# Patient Record
Sex: Female | Born: 1965 | Hispanic: Yes | Smoking: Never smoker
Health system: Southern US, Community
[De-identification: ages and names within clinical notes are randomized; demographics above are authoritative.]

## PROBLEM LIST (undated history)

## (undated) DIAGNOSIS — F410 Panic disorder [episodic paroxysmal anxiety] without agoraphobia: Secondary | ICD-10-CM

## (undated) DIAGNOSIS — F419 Anxiety disorder, unspecified: Secondary | ICD-10-CM

## (undated) DIAGNOSIS — D509 Iron deficiency anemia, unspecified: Secondary | ICD-10-CM

---

## 2010-01-08 ENCOUNTER — Ambulatory Visit: Payer: Self-pay | Admitting: Certified Nurse Midwife

## 2012-03-16 ENCOUNTER — Emergency Department: Payer: Self-pay | Admitting: Emergency Medicine

## 2012-08-16 ENCOUNTER — Emergency Department: Payer: Self-pay | Admitting: Emergency Medicine

## 2012-08-16 LAB — CBC WITH DIFFERENTIAL/PLATELET
Eosinophil %: 1.5 %
HCT: 39.4 % (ref 35.0–47.0)
Lymphocyte #: 3 10*3/uL (ref 1.0–3.6)
Lymphocyte %: 33.6 %
MCH: 29.9 pg (ref 26.0–34.0)
MCHC: 33.5 g/dL (ref 32.0–36.0)
MCV: 89 fL (ref 80–100)
Monocyte #: 0.7 x10 3/mm (ref 0.2–0.9)
Neutrophil #: 5.1 10*3/uL (ref 1.4–6.5)
Neutrophil %: 56.5 %
RBC: 4.4 10*6/uL (ref 3.80–5.20)

## 2012-08-16 LAB — URINALYSIS, COMPLETE
Bacteria: NONE SEEN
Bilirubin,UR: NEGATIVE
Ketone: NEGATIVE
Leukocyte Esterase: NEGATIVE
Nitrite: NEGATIVE
Ph: 8 (ref 4.5–8.0)
RBC,UR: 1 /HPF (ref 0–5)
Specific Gravity: 1.005 (ref 1.003–1.030)
Squamous Epithelial: 2

## 2012-08-16 LAB — COMPREHENSIVE METABOLIC PANEL
Albumin: 3.9 g/dL (ref 3.4–5.0)
Alkaline Phosphatase: 75 U/L (ref 50–136)
BUN: 8 mg/dL (ref 7–18)
Calcium, Total: 8.6 mg/dL (ref 8.5–10.1)
Creatinine: 0.58 mg/dL — ABNORMAL LOW (ref 0.60–1.30)
EGFR (African American): >60
EGFR (Non-African Amer.): >60
SGOT(AST): 21 U/L (ref 15–37)
SGPT (ALT): 18 U/L (ref 12–78)
Sodium: 141 mmol/L (ref 136–145)
Total Protein: 7.5 g/dL (ref 6.4–8.2)

## 2012-11-08 ENCOUNTER — Emergency Department: Payer: Self-pay | Admitting: Emergency Medicine

## 2012-11-09 LAB — COMPREHENSIVE METABOLIC PANEL
Albumin: 3.5 g/dL (ref 3.4–5.0)
Alkaline Phosphatase: 81 U/L (ref 50–136)
BUN: 5 mg/dL — ABNORMAL LOW (ref 7–18)
Bilirubin,Total: 0.6 mg/dL (ref 0.2–1.0)
Chloride: 103 mmol/L (ref 98–107)
Co2: 27 mmol/L (ref 21–32)
Creatinine: 0.56 mg/dL — ABNORMAL LOW (ref 0.60–1.30)
Glucose: 89 mg/dL (ref 65–99)
Potassium: 3.5 mmol/L (ref 3.5–5.1)
SGOT(AST): 18 U/L (ref 15–37)
Sodium: 139 mmol/L (ref 136–145)
Total Protein: 7 g/dL (ref 6.4–8.2)

## 2012-11-09 LAB — URINALYSIS, COMPLETE
Bilirubin,UR: NEGATIVE
Leukocyte Esterase: NEGATIVE
Nitrite: NEGATIVE
Protein: NEGATIVE
RBC,UR: 1 /HPF (ref 0–5)
Specific Gravity: 1.005 (ref 1.003–1.030)
Squamous Epithelial: 1
WBC UR: NONE SEEN /HPF (ref 0–5)

## 2012-11-09 LAB — CBC
HCT: 36.6 % (ref 35.0–47.0)
HGB: 12.3 g/dL (ref 12.0–16.0)
MCH: 29.6 pg (ref 26.0–34.0)
MCHC: 33.6 g/dL (ref 32.0–36.0)
MCV: 88 fL (ref 80–100)
RBC: 4.16 10*6/uL (ref 3.80–5.20)
RDW: 13.3 % (ref 11.5–14.5)
WBC: 7.4 10*3/uL (ref 3.6–11.0)

## 2013-02-18 ENCOUNTER — Emergency Department: Payer: Self-pay | Admitting: Emergency Medicine

## 2013-02-18 LAB — COMPREHENSIVE METABOLIC PANEL
Alkaline Phosphatase: 79 U/L (ref 50–136)
Anion Gap: 7 (ref 7–16)
BUN: 7 mg/dL (ref 7–18)
Bilirubin,Total: 0.2 mg/dL (ref 0.2–1.0)
Calcium, Total: 8.4 mg/dL — ABNORMAL LOW (ref 8.5–10.1)
Chloride: 105 mmol/L (ref 98–107)
Co2: 27 mmol/L (ref 21–32)
EGFR (African American): >60
EGFR (Non-African Amer.): >60
Glucose: 81 mg/dL (ref 65–99)
Osmolality: 275 (ref 275–301)
SGOT(AST): 21 U/L (ref 15–37)
SGPT (ALT): 19 U/L (ref 12–78)
Sodium: 139 mmol/L (ref 136–145)
Total Protein: 7.2 g/dL (ref 6.4–8.2)

## 2013-02-18 LAB — DRUG SCREEN, URINE
Amphetamines, Ur Screen: NEGATIVE (ref ?–1000)
Barbiturates, Ur Screen: NEGATIVE (ref ?–200)
Benzodiazepine, Ur Scrn: NEGATIVE (ref ?–200)
Cocaine Metabolite,Ur ~~LOC~~: NEGATIVE (ref ?–300)
MDMA (Ecstasy)Ur Screen: NEGATIVE (ref ?–500)
Opiate, Ur Screen: NEGATIVE (ref ?–300)
Tricyclic, Ur Screen: NEGATIVE (ref ?–1000)

## 2013-02-18 LAB — URINALYSIS, COMPLETE
Bilirubin,UR: NEGATIVE
Glucose,UR: NEGATIVE mg/dL (ref 0–75)
Ketone: NEGATIVE
Leukocyte Esterase: NEGATIVE
Ph: 7 (ref 4.5–8.0)
Protein: NEGATIVE
RBC,UR: NONE SEEN /HPF (ref 0–5)
Specific Gravity: 1.002 (ref 1.003–1.030)
Squamous Epithelial: 1

## 2013-02-18 LAB — CBC
HCT: 36.5 % (ref 35.0–47.0)
HGB: 12.8 g/dL (ref 12.0–16.0)
MCHC: 35 g/dL (ref 32.0–36.0)
MCV: 89 fL (ref 80–100)
Platelet: 357 10*3/uL (ref 150–440)
RBC: 4.12 10*6/uL (ref 3.80–5.20)
RDW: 13.4 % (ref 11.5–14.5)

## 2013-02-18 LAB — PREGNANCY, URINE: Pregnancy Test, Urine: NEGATIVE m[IU]/mL

## 2013-02-18 LAB — TSH: Thyroid Stimulating Horm: 1.95 u[IU]/mL

## 2013-02-18 LAB — ETHANOL: Ethanol: <3 mg/dL

## 2013-08-16 ENCOUNTER — Inpatient Hospital Stay: Payer: Self-pay | Admitting: Psychiatry

## 2013-08-16 LAB — COMPREHENSIVE METABOLIC PANEL
ALK PHOS: 73 U/L
AST: 19 U/L (ref 15–37)
Albumin: 3.2 g/dL — ABNORMAL LOW (ref 3.4–5.0)
Anion Gap: 5 — ABNORMAL LOW (ref 7–16)
BILIRUBIN TOTAL: 0.2 mg/dL (ref 0.2–1.0)
BUN: 10 mg/dL (ref 7–18)
Calcium, Total: 8.1 mg/dL — ABNORMAL LOW (ref 8.5–10.1)
Chloride: 106 mmol/L (ref 98–107)
Co2: 26 mmol/L (ref 21–32)
Creatinine: 0.53 mg/dL — ABNORMAL LOW (ref 0.60–1.30)
EGFR (Non-African Amer.): >60
GLUCOSE: 84 mg/dL (ref 65–99)
Osmolality: 272 (ref 275–301)
Potassium: 3.9 mmol/L (ref 3.5–5.1)
SGPT (ALT): 20 U/L (ref 12–78)
Sodium: 137 mmol/L (ref 136–145)
Total Protein: 6.8 g/dL (ref 6.4–8.2)

## 2013-08-16 LAB — URINALYSIS, COMPLETE
Bacteria: NONE SEEN
Bilirubin,UR: NEGATIVE
Glucose,UR: NEGATIVE mg/dL (ref 0–75)
Ketone: NEGATIVE
LEUKOCYTE ESTERASE: NEGATIVE
Nitrite: NEGATIVE
PROTEIN: NEGATIVE
Ph: 8 (ref 4.5–8.0)
SPECIFIC GRAVITY: 1.004 (ref 1.003–1.030)
Squamous Epithelial: 2
WBC UR: <1 /HPF (ref 0–5)

## 2013-08-16 LAB — ACETAMINOPHEN LEVEL: Acetaminophen: <2 ug/mL

## 2013-08-16 LAB — DRUG SCREEN, URINE

## 2013-08-16 LAB — CBC
HCT: 36.7 % (ref 35.0–47.0)
HGB: 12.5 g/dL (ref 12.0–16.0)
MCH: 31 pg (ref 26.0–34.0)
MCHC: 33.9 g/dL (ref 32.0–36.0)
MCV: 92 fL (ref 80–100)
Platelet: 346 10*3/uL (ref 150–440)
RBC: 4.01 10*6/uL (ref 3.80–5.20)
RDW: 13.4 % (ref 11.5–14.5)
WBC: 7.7 10*3/uL (ref 3.6–11.0)

## 2013-08-16 LAB — SALICYLATE LEVEL: Salicylates, Serum: <1.7 mg/dL

## 2013-08-16 LAB — ETHANOL
Ethanol %: <0.003 % (ref 0.000–0.080)
Ethanol: <3 mg/dL

## 2014-11-11 NOTE — Discharge Summary (Signed)
PATIENT NAME:  Kayla Ball, Kayla Ball MR#:  [id number] DATE OF BIRTH:  1965-07-27  DATE OF ADMISSION:  08/16/2013 DATE OF DISCHARGE:  08/18/2013  HOSPITAL COURSE:  See dictated history and physical for details of admission.  This 49 year old woman was brought to the hospital with allegations that she had attempted to overdose on clonazepam.  Initially she was documented in the Emergency Room as admitting to this being intentional, but to my interview she has consistently denied it.  The patient has insisted that she has no suicidal ideation.  She admits that she has been depressed and anxious.  She has been under a great deal of stress recently, having lost her job and not receiving unemployment.  In the hospital, the patient remained fairly fixated on discharge.  She did not participate in any group therapy as far as I know.  She mostly stayed in bed.  She complained of nausea and vomiting on several occasions.  The patient adamantly denied suicidal ideation and her family visited and also stated they did not feel she was acutely dangerous and requested that she be discharged.  The patient was started on citalopram.  It was possible that this may have added to her nausea, but she was feeling very sick to her stomach even before taking it.  The patient has been encouraged to give the medication a longer trial to see if she can tolerate it.  She is also taking trazodone to assist with sleep.  The patient was educated about the risk of suicidality and the importance of staying active in treatment and the strong likelihood of improvement in symptoms if she just persists in treatment.  She understood and agreed to all of this treatment plan.  She is referred to follow up with walk-in at [facility] starting tomorrow.   DISCHARGE MEDICATIONS:  Citalopram 20 mg per day, trazodone 50 mg at night.   LABORATORY RESULTS:  Pregnancy test negative.  Salicylates and acetaminophen negative.  Alcohol negative.  Chemistry  panel shows low albumin 3.2, calcium low 8.1, creatinine 0.53.  CBC normal.  Urinalysis, slight blood, no infection.  Drug screen negative.   MENTAL STATUS EXAMINATION AT DISCHARGE:  The patient is slightly disheveled, wearing hospital pajamas.  Passively cooperative with the interview.  Intermittent eye contact.  Restricted psychomotor activity.  Speech slow, decreased in amount.  Affect blunted.  Mood stated as anxious.  Thoughts are lucid.  No signs of loosening of associations or delusions.  Denies auditory or visual hallucinations.  Denies any suicidal or homicidal ideation.  States positive things in her life to live for, mostly her family and hope to get back to work.  She agrees to outpatient treatment.  She agrees to continue current medication.  Insight and judgment improved.  Intelligence normal.  Alert and oriented x 4.   DISPOSITION:  As noted above, discharged home.  Follow up with [facility].   DIAGNOSIS, PRINCIPAL AND PRIMARY:  AXIS I:  Major depression, recurrent, moderate.   SECONDARY DIAGNOSES: AXIS I:  Generalized anxiety disorder.  AXIS II:  Deferred.  AXIS III:  Nausea, probably related to her anxiety.  AXIS IV:  Severe, recent job loss and financial difficulties.  AXIS V:  Functioning at time of discharge 50.    ____________________________ Audery AmelJohn T. Clapacs, MD jtc:ea D: 08/18/2013 21:39:56 ET T: 08/19/2013 02:36:45 ET JOB#: [id number]  cc: Audery AmelJohn T. Clapacs, MD, <Dictator> Audery AmelJOHN T CLAPACS MD ELECTRONICALLY SIGNED 08/19/2013 9:37
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
## 2014-11-11 NOTE — Consult Note (Signed)
PATIENT NAME:  Kayla Ball, Kayla Ball MR#:  [id number] DATE OF BIRTH:  10/20/1965  DATE OF CONSULTATION:  08/16/2013  REFERRING PHYSICIAN:   Dr. Sharyn Creamer  CONSULTING PHYSICIAN:  Ardeen Fillers. Garnetta Buddy, MD  REASON FOR ADMISSION:  The patient overdosed on 5 Klonopin and wants to end life.   HISTORY OF PRESENT ILLNESS:  The patient is a 49 year old Hispanic female who was brought in by the EMS after she tried to hurt herself by taking an overdose of Klonopin. She appeared very depressed when she was evaluated in the Emergency Room. She reported that she has been having depressive symptoms and wants to end it all. She reported feelings of depression, anxiety, hopelessness, helplessness, and was unable to contract for safety. She reported that she has several stressors at home and is currently living with her boyfriend. She reported that she currently has 2 young children at home and her boyfriend is very supportive. She stated that she has history of 2 previous suicide attempts in the past and was supposed to be seeing a counselor last week, but she missed her appointment. The patient was unable to contract for safety at this time.   PAST PSYCHIATRIC HISTORY: The patient has history of previous suicide attempt 2 to 3 times in the past. She was unable to tell in detail of what happened. She reported that she was taken to the [facility] in [place] at that time. She is currently given a prescription of Klonopin 0.5 mg at bedtime as well as sertraline 50 mg, but she did not pick up her medications. She endorses depressive symptoms as well as anxiety.   PAST MEDICAL HISTORY: None reported.   ALLERGIES: NO KNOWN DRUG ALLERGIES.  DRUG HISTORY: The patient currently denied using any drugs or alcohol at this time.   SOCIAL HISTORY: The patient reportedly lives with her boyfriend and has two older children as well as having 2 young children. She reported that she has some issues with her son who is 16 years old. She  reported that her 33 year old daughter has a good relationship with her. She also takes care of two younger children who are in school at this time. The patient denied having any perceptual disturbances.   VITAL SIGNS:  Temperature 98, pulse 120, respirations 20, blood pressure 138/84.   LABORATORY DATA: Glucose 84, BUN 10, creatinine 0.53, sodium 137, potassium 3.9, chloride 106, bicarbonate 26, anion gap 5, osmolality 272, calcium 8.1. Blood alcohol level less than 3. Protein 6.8, albumin 3.2, bilirubin 0.2, alkaline phosphatase 73, AST 19, ALT 20. UDS was negative. WBC 7.7, RBC 4.01, hemoglobin 12.5, hematocrit 36.7, MCV 92, RDW 13.4.  REVIEW OF SYSTEMS:   CONSTITUTIONAL: Denies any fever or chills. No weight changes.  EYES: No double or blurred vision.  RESPIRATORY: No shortness of breath or cough.  CARDIOVASCULAR: No chest pain or orthopnea.  GASTROINTESTINAL: No abdominal pain, nausea, vomiting, diarrhea.  GENITOURINARY: No incontinence or frequency.  ENDOCRINE: No heat or cold intolerance.  LYMPHATIC: No anemia or easy bruising.  INTEGUMENTARY: No acne or rash.  MUSCULOSKELETAL: No muscle or joint pain.   MENTAL STATUS EXAMINATION: The patient is a moderately built female who was lying in the bed. She maintained fair eye contact. Her speech was low in tone and volume. Mood was depressed and anxious. Affect was congruent. Thought process was logical, goal-directed. Thought content was nondelusional. She appeared depressed. She denied having any perceptual disturbances.   DIAGNOSTIC IMPRESSION: AXIS I: Major depressive disorder, recurrent, severe, without  psychotic features.  AXIS II: None.  AXIS III: None reported.   TREATMENT PLAN: 1.  The patient will be admitted to the inpatient behavioral health unit for stabilization and safety.  2.  She will be started on Zoloft 50 mg p.o. q.a.m.  3.  She will be continued on trazodone 50 mg p.o. at bedtime for insomnia.  4.  We will obtain  collateral information from her boyfriend at this time. She will be monitored closely by the staff.   Thank you for allowing me to participate in the care of this patient.   ____________________________ Ardeen FillersUzma S. Garnetta BuddyFaheem, MD usf:dp D: 08/16/2013 15:41:52 ET T: 08/16/2013 15:47:39 ET JOB#: [id number]  cc: Ardeen FillersUzma S. Garnetta BuddyFaheem, MD, <Dictator> Rhunette CroftUZMA S Emalia Witkop MD ELECTRONICALLY SIGNED 08/23/2013 9:19

## 2015-10-16 ENCOUNTER — Encounter: Payer: Self-pay | Admitting: Emergency Medicine

## 2015-10-16 ENCOUNTER — Emergency Department: Payer: No Typology Code available for payment source

## 2015-10-16 ENCOUNTER — Emergency Department
Admission: EM | Admit: 2015-10-16 | Discharge: 2015-10-16 | Disposition: A | Discharge status: Home / Self Care | Payer: No Typology Code available for payment source | Attending: Emergency Medicine | Admitting: Emergency Medicine

## 2015-10-16 DIAGNOSIS — S134XXA Sprain of ligaments of cervical spine, initial encounter: Secondary | ICD-10-CM | POA: Insufficient documentation

## 2015-10-16 DIAGNOSIS — S199XXA Unspecified injury of neck, initial encounter: Secondary | ICD-10-CM | POA: Diagnosis present

## 2015-10-16 DIAGNOSIS — Y939 Activity, unspecified: Secondary | ICD-10-CM | POA: Diagnosis not present

## 2015-10-16 DIAGNOSIS — Y999 Unspecified external cause status: Secondary | ICD-10-CM | POA: Diagnosis not present

## 2015-10-16 DIAGNOSIS — Y92410 Unspecified street and highway as the place of occurrence of the external cause: Secondary | ICD-10-CM | POA: Insufficient documentation

## 2015-10-16 MED ORDER — CYCLOBENZAPRINE HCL 10 MG PO TABS
5.0000 mg | ORAL_TABLET | Freq: Once | ORAL | Status: AC
  Administered 2015-10-16: 5 mg via ORAL
  Filled 2015-10-16: qty 1

## 2015-10-16 MED ORDER — ACETAMINOPHEN 325 MG PO TABS
650.0000 mg | ORAL_TABLET | Freq: Once | ORAL | Status: AC
  Administered 2015-10-16: 650 mg via ORAL
  Filled 2015-10-16: qty 2

## 2015-10-16 MED ORDER — NAPROXEN 500 MG PO TBEC: 500.0000 mg | DELAYED_RELEASE_TABLET | Freq: Two times a day (BID) | ORAL | Status: DC

## 2015-10-16 MED ORDER — CYCLOBENZAPRINE HCL 5 MG PO TABS: 5.0000 mg | ORAL_TABLET | Freq: Three times a day (TID) | ORAL | Status: DC | PRN
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
## 2016-05-19 ENCOUNTER — Encounter: Payer: Self-pay | Admitting: Emergency Medicine

## 2016-05-19 ENCOUNTER — Emergency Department
Admission: EM | Admit: 2016-05-19 | Discharge: 2016-05-19 | Disposition: A | Discharge status: Home / Self Care | Payer: Self-pay | Attending: Emergency Medicine | Admitting: Emergency Medicine

## 2016-05-19 DIAGNOSIS — X58XXXA Exposure to other specified factors, initial encounter: Secondary | ICD-10-CM | POA: Insufficient documentation

## 2016-05-19 DIAGNOSIS — S0502XA Injury of conjunctiva and corneal abrasion without foreign body, left eye, initial encounter: Secondary | ICD-10-CM | POA: Insufficient documentation

## 2016-05-19 DIAGNOSIS — Y999 Unspecified external cause status: Secondary | ICD-10-CM | POA: Insufficient documentation

## 2016-05-19 DIAGNOSIS — Y939 Activity, unspecified: Secondary | ICD-10-CM | POA: Insufficient documentation

## 2016-05-19 DIAGNOSIS — Y929 Unspecified place or not applicable: Secondary | ICD-10-CM | POA: Insufficient documentation

## 2016-05-19 MED ORDER — CIPROFLOXACIN HCL 0.3 % OP SOLN
OPHTHALMIC | Status: AC
  Filled 2016-05-19: qty 2.5

## 2016-05-19 MED ORDER — TETRACAINE HCL 0.5 % OP SOLN
OPHTHALMIC | Status: AC
  Filled 2016-05-19: qty 2

## 2016-05-19 MED ORDER — CIPROFLOXACIN HCL 0.3 % OP SOLN
2.0000 [drp] | OPHTHALMIC | Status: DC
  Administered 2016-05-19: 2 [drp] via OPHTHALMIC

## 2016-05-19 MED ORDER — KETOROLAC TROMETHAMINE 0.5 % OP SOLN: 1.0000 [drp] | Freq: Four times a day (QID) | OPHTHALMIC | 0 refills | Status: DC

## 2016-05-19 MED ORDER — FLUORESCEIN SODIUM 1 MG OP STRP
ORAL_STRIP | OPHTHALMIC | Status: AC
  Filled 2016-05-19: qty 1

## 2016-05-19 MED ORDER — CIPROFLOXACIN HCL 0.3 % OP SOLN: 1.0000 [drp] | OPHTHALMIC | 0 refills | Status: AC

## 2016-05-19 NOTE — ED Triage Notes (Addendum)
C/O left eye pain this morning.  Pain started after washing face with soap this morning.  Also c/o cough for the past 3 days.

## 2016-05-19 NOTE — ED Notes (Signed)
See triage note   States she developed some pain to left eye this am while in shower after washing her face  Describes as a "rock" in her eye  Increased pain when she opens her eye

## 2016-05-19 NOTE — ED Provider Notes (Signed)
[facility] Emergency Department Provider Note ____________________________________________  Time seen: Approximately 7:37 AM  I have reviewed the triage vital signs and the nursing notes.   HISTORY  Chief Complaint Eye Pain   HPI Kayla Ball is a 50 y.o. female who presents to the emergency department for evaluation of left eye pain. She states that she awakened this morning with pain and redness. No known injury. She does not wear contact lenses. She has not had any drainage. No vision changes.  History reviewed. No pertinent past medical history.  There are no active problems to display for this patient.   History reviewed. No pertinent surgical history.  Prior to Admission medications   Medication Sig Start Date End Date Taking? Authorizing Provider  ciprofloxacin (CILOXAN) 0.3 % ophthalmic solution Place 1 drop into the left eye every 4 (four) hours while awake. 05/19/16 05/24/16  Chinita Pesterari B Anginette Espejo, FNP  ketorolac (ACULAR) 0.5 % ophthalmic solution Place 1 drop into the left eye 4 (four) times daily. 05/19/16   Chinita Pesterari B Abdel Effinger, FNP    Allergies Review of patient's allergies indicates no known allergies.  No family history on file.  Social History Social History  Substance Use Topics  . Smoking status: Never Smoker  . Smokeless tobacco: Never Used  . Alcohol use Not on file    Review of Systems   Constitutional: No fever/chills Eyes: Negative for visual changes. Positive for pain. Musculoskeletal: Negative for pain. Skin: Negative for rash. Neurological: Negative for headaches, focal weakness or numbness. Allergic: Negative for seasonal allergies. ____________________________________________  PHYSICAL EXAM:  VITAL SIGNS: ED Triage Vitals  Enc Vitals Group     BP 05/19/16 0731 (!) 147/96     Pulse Rate 05/19/16 0731 70     Resp 05/19/16 0731 16     Temp 05/19/16 0731 97.6 F (36.4 C)     Temp Source 05/19/16 0731 Oral     SpO2  05/19/16 0731 98 %     Weight 05/19/16 0729 121 lb (54.9 kg)     Height 05/19/16 0729 5\' 4"  (1.626 m)     Head Circumference --      Peak Flow --      Pain Score 05/19/16 0729 10     Pain Loc --      Pain Edu? --      Excl. in GC? --     Constitutional: Alert and oriented. Well appearing and in no acute distress. Eyes: No globe trauma; Eyelids normal to inspection; Sclera appears anicteric.  Eyelid was inverted. Conjunctiva appears erythematous; approximately 3mm corneal abrasion at 6 o'clock position. Head: Atraumatic. Nose: No congestion/rhinnorhea. Mouth/Throat: Mucous membranes are moist.  Oropharynx non-erythematous. Respiratory: Even and unlabored. Musculoskeletal:Normal ROM x 4 extremities. Neurologic:  Normal speech and language. No gross focal neurologic deficits are appreciated. Speech is normal. No gait instability. Skin:  Skin is warm, dry and intact. No rash noted. Psychiatric: Mood and affect are normal. Speech and behavior are normal.  ____________________________________________   LABS (all labs ordered are listed, but only abnormal results are displayed)  Labs Reviewed - No data to display ____________________________________________  EKG   ____________________________________________  RADIOLOGY   ____________________________________________   PROCEDURES  Procedure(s) performed:  ____________________________________________   INITIAL IMPRESSION / ASSESSMENT AND PLAN / ED COURSE  Pertinent labs & imaging results that were available during my care of the patient were reviewed by me and considered in my medical decision making (see chart for details).  Clinical Course  Patient to receive prescriptions for Ciprofloxacin ophthalmic solution and Acular drops.  She was advised to follow up with ophthalmology for symptoms that are not improving over the next 2 days. She was  also advised to return to the ER for symptoms that change or worsen if  unable to schedule an appointment.  ____________________________________________   FINAL CLINICAL IMPRESSION(S) / ED DIAGNOSES  Final diagnoses:  Abrasion of left cornea, initial encounter    Note:  This document was prepared using Dragon voice recognition software and may include unintentional dictation errors.    Chinita PesterCari B Bridget Westbrooks, FNP 05/19/16 [id number]    Jene Everyobert Kinner, MD 05/19/16 [phone]

## 2016-12-29 ENCOUNTER — Emergency Department
Admission: EM | Admit: 2016-12-29 | Discharge: 2016-12-29 | Disposition: A | Discharge status: Home / Self Care | Payer: Self-pay | Attending: Emergency Medicine | Admitting: Emergency Medicine

## 2016-12-29 ENCOUNTER — Encounter: Payer: Self-pay | Admitting: Emergency Medicine

## 2016-12-29 ENCOUNTER — Emergency Department: Payer: Self-pay

## 2016-12-29 DIAGNOSIS — B9789 Other viral agents as the cause of diseases classified elsewhere: Secondary | ICD-10-CM

## 2016-12-29 DIAGNOSIS — J069 Acute upper respiratory infection, unspecified: Secondary | ICD-10-CM | POA: Insufficient documentation

## 2016-12-29 DIAGNOSIS — J209 Acute bronchitis, unspecified: Secondary | ICD-10-CM | POA: Insufficient documentation

## 2016-12-29 DIAGNOSIS — J40 Bronchitis, not specified as acute or chronic: Secondary | ICD-10-CM

## 2016-12-29 MED ORDER — IPRATROPIUM-ALBUTEROL 0.5-2.5 (3) MG/3ML IN SOLN
3.0000 mL | Freq: Four times a day (QID) | RESPIRATORY_TRACT | Status: DC
  Administered 2016-12-29: 3 mL via RESPIRATORY_TRACT

## 2016-12-29 MED ORDER — IPRATROPIUM-ALBUTEROL 0.5-2.5 (3) MG/3ML IN SOLN
RESPIRATORY_TRACT | Status: AC
  Filled 2016-12-29: qty 3

## 2016-12-29 MED ORDER — ONDANSETRON 4 MG PO TBDP
ORAL_TABLET | ORAL | Status: AC
  Filled 2016-12-29: qty 1

## 2016-12-29 MED ORDER — ONDANSETRON 4 MG PO TBDP
4.0000 mg | ORAL_TABLET | Freq: Once | ORAL | Status: AC
  Administered 2016-12-29: 4 mg via ORAL

## 2016-12-29 MED ORDER — PREDNISONE 10 MG PO TABS: 10.0000 mg | ORAL_TABLET | Freq: Two times a day (BID) | ORAL | 0 refills | Status: DC

## 2016-12-29 MED ORDER — DEXAMETHASONE SODIUM PHOSPHATE 10 MG/ML IJ SOLN
10.0000 mg | Freq: Once | INTRAMUSCULAR | Status: AC
  Administered 2016-12-29: 10 mg via INTRAMUSCULAR
  Filled 2016-12-29: qty 1

## 2016-12-29 MED ORDER — BENZONATATE 100 MG PO CAPS: 100.0000 mg | ORAL_CAPSULE | Freq: Three times a day (TID) | ORAL | 0 refills | Status: DC | PRN

## 2016-12-29 MED ORDER — ACETAMINOPHEN-CODEINE #3 300-30 MG PO TABS
1.0000 | ORAL_TABLET | Freq: Once | ORAL | Status: AC
  Administered 2016-12-29: 1 via ORAL
  Filled 2016-12-29: qty 1

## 2016-12-29 MED ORDER — PSEUDOEPH-BROMPHEN-DM 30-2-10 MG/5ML PO SYRP: 5.0000 mL | ORAL_SOLUTION | Freq: Four times a day (QID) | ORAL | 0 refills | Status: DC | PRN

## 2016-12-29 MED ORDER — FLUTICASONE PROPIONATE 50 MCG/ACT NA SUSP: 2.0000 | Freq: Every day | NASAL | 0 refills | Status: DC

## 2016-12-29 NOTE — ED Provider Notes (Signed)
[facility] Emergency Department Provider Note ____________________________________________  Time seen: [id number]  I have reviewed the triage vital signs and the nursing notes.  HISTORY  Chief Complaint  Nasal Congestion and Generalized Body Aches  HPI Kayla Ball is a 51 y.o. female visits to the ED for a three-day complaint of intermittent cough, congestion, and body aches. She also reports some nasal congestion with nausea. She reports temps no higher than 29F. She also reports similar symptoms in her husband. She denies any recent travel, sick contacts, or other exposures. She has been taking DayQuil, NyQuil, ibuprofen, and Tylenol with limited benefit.  History reviewed. No pertinent past medical history.  There are no active problems to display for this patient.  History reviewed. No pertinent surgical history.  Prior to Admission medications   Medication Sig Start Date End Date Taking? Authorizing Provider  benzonatate (TESSALON PERLES) 100 MG capsule Take 1 capsule (100 mg total) by mouth 3 (three) times daily as needed for cough (Take 1-2 per dose). 12/29/16   Oluwateniola Leitch, Charlesetta IvoryJenise V Bacon, PA-C  brompheniramine-pseudoephedrine-DM 30-2-10 MG/5ML syrup Take 5 mLs by mouth 4 (four) times daily as needed. 12/29/16   Carnel Stegman, Charlesetta IvoryJenise V Bacon, PA-C  fluticasone (FLONASE) 50 MCG/ACT nasal spray Place 2 sprays into both nostrils daily. 12/29/16   Monika Chestang, Charlesetta IvoryJenise V Bacon, PA-C  ketorolac (ACULAR) 0.5 % ophthalmic solution Place 1 drop into the left eye 4 (four) times daily. 05/19/16   Triplett, Rulon Eisenmengerari B, FNP  predniSONE (DELTASONE) 10 MG tablet Take 1 tablet (10 mg total) by mouth 2 (two) times daily with a meal. 12/29/16   Mercedies Ganesh, Charlesetta IvoryJenise V Bacon, PA-C   Allergies Patient has no known allergies.  No family history on file.  Social History Social History  Substance Use Topics  . Smoking status: Never Smoker  . Smokeless tobacco: Never Used  . Alcohol use Not on  file    Review of Systems  Constitutional: Negative for fever. Eyes: Negative for visual changes. ENT: Negative for sore throat. Reports nasal congestion. Cardiovascular: Negative for chest pain. Respiratory: Negative for shortness of breath. Gastrointestinal: Negative for abdominal pain and diarrhea. Reports intermittent nausea Genitourinary: Negative for dysuria. Musculoskeletal: Negative for back pain. Reports generalized bodyaches. Skin: Negative for rash. Neurological: Negative for focal weakness or numbness. Reports generalized headaches.  ____________________________________________  PHYSICAL EXAM:  VITAL SIGNS: ED Triage Vitals  Enc Vitals Group     BP 12/29/16 1807 (!) 172/92     Pulse Rate 12/29/16 1807 (!) 116     Resp 12/29/16 1807 18     Temp 12/29/16 1807 99.4 F (37.4 C)     Temp Source 12/29/16 1807 Oral     SpO2 12/29/16 1807 98 %     Weight 12/29/16 1807 128 lb (58.1 kg)     Height 12/29/16 1807 5\' 4"  (1.626 m)     Head Circumference --      Peak Flow --      Pain Score 12/29/16 1806 6     Pain Loc --      Pain Edu? --      Excl. in GC? --     Constitutional: Alert and oriented. Well appearing and in no distress. Head: Normocephalic and atraumatic. Eyes: Conjunctivae are normal. PERRL. Normal extraocular movements Ears: Canals clear. TMs intact bilaterally. Nose: No congestion/rhinorrhea/epistaxis. Mouth/Throat: Mucous membranes are moist. Cardiovascular: Normal rate, regular rhythm. Normal distal pulses. Respiratory: Normal respiratory effort. No wheezes/rales/rhonchi. Skin:  Skin is warm,  dry and intact. No rash noted. ____________________________________________   RADIOLOGY  CXR  IMPRESSION: Increased interstitial prominence noted bilaterally suspicious for acute bronchitic change. Left lower lobe subsegmental atelectasis. Aortic atherosclerosis. ____________________________________________  PROCEDURES  Tylenol #3 PO DuoNeb x  1 Decadron 10 mg IM Zofran 4 mg ODT ____________________________________________  INITIAL IMPRESSION / ASSESSMENT AND PLAN / ED COURSE  Patient with acute respiratory infection consistent with a viral bronchitis. X-ray confirms bronchitic changes. She is discharged with prescriptions for prednisone, Flonase, Bromfed-DM syrup, and Tessalon Perles. She will follow with her primary care provider for ongoing symptom management. Return precautions were reviewed. ____________________________________________  FINAL CLINICAL IMPRESSION(S) / ED DIAGNOSES  Final diagnoses:  Bronchitis  Viral URI with cough      Rinoa Garramone, Charlesetta Ivory, PA-C 12/29/16 2035    Loleta Rose, MD 12/29/16 2114

## 2016-12-29 NOTE — ED Triage Notes (Signed)
Pt reports nasal congestion, cough, body aches and headache for two days.

## 2016-12-29 NOTE — Discharge Instructions (Signed)
Your exam and x-ray reveals a bronchitis. Take the prescription meds as directed. Follow-up with your provider for continued symptoms. Drink plenty of fluids and rest. Return to the ED as needed.

## 2016-12-29 NOTE — ED Notes (Signed)
See triage note  States she developed body aches and nasal congestion couple of days ago  Low grade fever on arrival

## 2016-12-30 ENCOUNTER — Encounter: Payer: Self-pay | Admitting: Emergency Medicine

## 2017-04-19 ENCOUNTER — Emergency Department
Admission: EM | Admit: 2017-04-19 | Discharge: 2017-04-19 | Disposition: A | Discharge status: Home / Self Care | Payer: Self-pay | Attending: Emergency Medicine | Admitting: Emergency Medicine

## 2017-04-19 ENCOUNTER — Encounter: Payer: Self-pay | Admitting: Emergency Medicine

## 2017-04-19 ENCOUNTER — Emergency Department: Payer: Self-pay

## 2017-04-19 DIAGNOSIS — Z79899 Other long term (current) drug therapy: Secondary | ICD-10-CM | POA: Insufficient documentation

## 2017-04-19 DIAGNOSIS — F419 Anxiety disorder, unspecified: Secondary | ICD-10-CM | POA: Insufficient documentation

## 2017-04-19 HISTORY — DX: Panic disorder (episodic paroxysmal anxiety): F41.0

## 2017-04-19 HISTORY — DX: Anxiety disorder, unspecified: F41.9

## 2017-04-19 HISTORY — DX: Iron deficiency anemia, unspecified: D50.9

## 2017-04-19 LAB — BASIC METABOLIC PANEL
Anion gap: 10 (ref 5–15)
BUN: 8 mg/dL (ref 6–20)
CALCIUM: 9.1 mg/dL (ref 8.9–10.3)
CHLORIDE: 97 mmol/L — AB (ref 101–111)
CO2: 25 mmol/L (ref 22–32)
CREATININE: 0.65 mg/dL (ref 0.44–1.00)
GFR calc non Af Amer: >60 mL/min (ref >60)
GLUCOSE: 86 mg/dL (ref 65–99)
Potassium: 3.4 mmol/L — ABNORMAL LOW (ref 3.5–5.1)
Sodium: 132 mmol/L — ABNORMAL LOW (ref 135–145)

## 2017-04-19 LAB — CBC
HCT: 36.7 % (ref 35.0–47.0)
HEMOGLOBIN: 12.9 g/dL (ref 12.0–16.0)
MCH: 30.7 pg (ref 26.0–34.0)
MCHC: 35.1 g/dL (ref 32.0–36.0)
MCV: 87.5 fL (ref 80.0–100.0)
PLATELETS: 332 10*3/uL (ref 150–440)
RBC: 4.2 MIL/uL (ref 3.80–5.20)
RDW: 13 % (ref 11.5–14.5)
WBC: 5.5 10*3/uL (ref 3.6–11.0)

## 2017-04-19 LAB — TSH: TSH: 1.14 u[IU]/mL (ref 0.350–4.500)

## 2017-04-19 LAB — TROPONIN I: Troponin I: <0.03 ng/mL (ref <0.03)

## 2017-04-19 LAB — MAGNESIUM: Magnesium: 1.8 mg/dL (ref 1.7–2.4)

## 2017-04-19 MED ORDER — LORAZEPAM 2 MG/ML IJ SOLN
1.0000 mg | Freq: Once | INTRAMUSCULAR | Status: AC
  Administered 2017-04-19: 1 mg via INTRAVENOUS
  Filled 2017-04-19: qty 1

## 2017-04-19 MED ORDER — LORAZEPAM 0.5 MG PO TABS: 0.5000 mg | ORAL_TABLET | Freq: Three times a day (TID) | ORAL | 0 refills | Status: AC | PRN

## 2017-04-19 NOTE — ED Provider Notes (Addendum)
[facility] Emergency Department Provider Note  ____________________________________________   I have reviewed the triage vital signs and the nursing notes.   HISTORY  Chief Complaint Numbness and multiple medical complaints    HPI Kayla Ball is a 51 y.o. female  with a history of significant anxiety and panic attacks who presents today complaining of her entire body hurting, everything in her body tingling, and feeling decreased appetite for last few days. No SI no HI, patient then declined to give further history after revealing this information Level 5 chart caveat; no further history available due to patient status.   Past Medical History:  Diagnosis Date  . Anxiety   . IDA (iron deficiency anemia)   . Panic attacks     There are no active problems to display for this patient.   History reviewed. No pertinent surgical history.  Prior to Admission medications   Medication Sig Start Date End Date Taking? Authorizing Provider  benzonatate (TESSALON PERLES) 100 MG capsule Take 1 capsule (100 mg total) by mouth 3 (three) times daily as needed for cough (Take 1-2 per dose). 12/29/16   Menshew, Charlesetta Ivory, PA-C  brompheniramine-pseudoephedrine-DM 30-2-10 MG/5ML syrup Take 5 mLs by mouth 4 (four) times daily as needed. 12/29/16   Menshew, Charlesetta Ivory, PA-C  cyclobenzaprine (FLEXERIL) 5 MG tablet Take 1 tablet (5 mg total) by mouth every 8 (eight) hours as needed for muscle spasms. 10/16/15   Menshew, Charlesetta Ivory, PA-C  fluticasone (FLONASE) 50 MCG/ACT nasal spray Place 2 sprays into both nostrils daily. 12/29/16   Menshew, Charlesetta Ivory, PA-C  ketorolac (ACULAR) 0.5 % ophthalmic solution Place 1 drop into the left eye 4 (four) times daily. 05/19/16   Triplett, Rulon Eisenmenger B, FNP  naproxen (EC NAPROSYN) 500 MG EC tablet Take 1 tablet (500 mg total) by mouth 2 (two) times daily with a meal. 10/16/15   Menshew, Charlesetta Ivory, PA-C  predniSONE  (DELTASONE) 10 MG tablet Take 1 tablet (10 mg total) by mouth 2 (two) times daily with a meal. 12/29/16   Menshew, Charlesetta Ivory, PA-C    Allergies Patient has no known allergies.  No family history on file.  Social History Social History  Substance Use Topics  . Smoking status: Never Smoker  . Smokeless tobacco: Never Used  . Alcohol use Yes    Review of Systems Constitutional: No fever/chills Eyes: No visual changes. ENT: No sore throat. No stiff neck no neck pain Cardiovascular: Denies chest pain.except insofar she has pain in her entire body Respiratory: Denies shortness of breath. Gastrointestinal:   no vomiting.  No diarrhea.  No constipation. Genitourinary: Negative for dysuria. Musculoskeletal: Negative lower extremity swelling Skin: Negative for rash. Neurological: Negative for severe headaches, states her whole body is numb.   ____________________________________________   PHYSICAL EXAM:  VITAL SIGNS: ED Triage Vitals  Enc Vitals Group     BP 04/19/17 0941 (!) 134/94     Pulse Rate 04/19/17 0941 68     Resp 04/19/17 0941 16     Temp 04/19/17 0941 (!) 97.5 F (36.4 C)     Temp Source 04/19/17 0941 Oral     SpO2 04/19/17 0941 100 %     Weight 04/19/17 0942 128 lb (58.1 kg)     Height 04/19/17 0942  (1.626 m)     Head Circumference --      Peak Flow --      Pain Score 04/19/17 0941  3     Pain Loc --      Pain Edu? --      Excl. in GC? --     Constitutional: patient in no acute distress, however while talking to her she suddenly stops talking, she is not catatonic she does not appear to be having a seizure this no seizure-like activity she clearly will respond if you talk to her loudly or provide her with painful stimuli. If you hold her hand over her head and drop that she will gently put it down by her side. Eyes: Conjunctivae are normal Head: Atraumatic HEENT: No congestion/rhinnorhea. Mucous membranes are moist.  Oropharynx  non-erythematous Neck:   Nontender with no meningismus, no masses, no stridor Cardiovascular: Normal rate, regular rhythm. Grossly normal heart sounds.  Good peripheral circulation. Respiratory: Normal respiratory effort.  No retractions. Lungs CTAB. Abdominal: Soft and nontender. No distention. No guarding no rebound Back:  There is no focal tenderness or step off.  there is no midline tenderness there are no lesions noted. there is no CVA tenderness Musculoskeletal: No lower extremity tenderness, no upper extremity tenderness. No joint effusions, no DVT signs strong distal pulses no edema Neurologic:  Normal speech and language. No gross focal neurologic deficits are appreciated.  Skin:  Skin is warm, dry and intact. No rash noted. Psychiatric: Mood and affect are very anxious. Speech and behavior are unusual.  ____________________________________________   LABS (all labs ordered are listed, but only abnormal results are displayed)  Labs Reviewed  CBC  BASIC METABOLIC PANEL  TROPONIN I  MAGNESIUM  TSH    Pertinent labs  results that were available during my care of the patient were reviewed by me and considered in my medical decision making (see chart for details). ____________________________________________  EKG  I personally interpreted any EKGs ordered by me or triage rate 71, no acute ST elevation or depression normal axis normal EKG, sinus ____________________________________________  RADIOLOGY  Pertinent labs & imaging results that were available during my care of the patient were reviewed by me and considered in my medical decision making (see chart for details). If possible, patient and/or family made aware of any abnormal findings. ____________________________________________    PROCEDURES  Procedure(s) performed: None  Procedures  Critical Care performed: None  ____________________________________________   INITIAL IMPRESSION / ASSESSMENT AND PLAN / ED  COURSE  Pertinent labs & imaging results that were available during my care of the patient were reviewed by me and considered in my medical decision making (see chart for details). patient is here with generalized anxiety and symptoms that seem consistent with that. No SI no HI we'll give her antianxiety medications. Multiple prior visits to multiple different emergency departments for anxiety in the past. No SI and no HI, family states she is not eating or drinking because she is so stressed out she got it is somewhat at work. We will see if anxiolytic medication can help her. This time nothing to suggest the patient as ACS PE dissection myocarditis endocarditis pericarditis CVA Guillaume barret syndrome or significant electrolyte disturbance etc. We will however watch her closely and check a thyroid, lytes, etc.   ----------------------------------------- 1:09 PM on 04/19/2017 -----------------------------------------  Patient after Ativan feels much better, we did offer a psychiatry consult but she declines. She would prefer to follow with [facility], which are her psychiatrist. Patient states that she would like some day for anxiety she would also like a work note so that she can go see her psychiatrist.  She has no SI she has no HI, she does not wish to stay here in the emergency department any further and she has no other complaints. Return precautions and follow-up given and understood she'll be discharged with her husband she knows she must not drive after Ativan. ____________________________________________    FINAL CLINICAL IMPRESSION(S) / ED DIAGNOSES  Final diagnoses:  None      This chart was dictated using voice recognition software.  Despite best efforts to proofread,  errors can occur which can change meaning.      Jeanmarie Plant, MD 04/19/17 1112    Jeanmarie Plant, MD 04/19/17 1112    Jeanmarie Plant, MD 04/19/17 1310

## 2017-04-19 NOTE — ED Notes (Signed)
Patient reported to this RN she still feels anxious after ativan. MD made aware

## 2017-04-19 NOTE — ED Triage Notes (Signed)
Pt to ED with multiple medical complaints. Pt states that on Friday night she had a seizure and EMS came out and evaluated her but told her what she was having was not a seizure, pt was not evaluated at ED at the time. Pt states that she has also been having chest pain, back pain, nausea. Pt also c/o numbness in both hands and in her face. Pt states that she has felt like her "heart is jumping" at times. Pt does not appear to be in any distress at this time.

## 2017-04-19 NOTE — ED Notes (Addendum)
Pt was laying in bed speaking with this RN normally, pt states, "I feel dizzy", then pt turns her head towards her husband and closes her eyes, this RN repeatedly calls to patient, pt does not turn head or acknowledge this RN speaking to her. VSS, HR 62, BP 129/93. Corneal reflexes intact. Pt opens eyes and makes eye contact with very light sternal rub. Pt noted to occasionally answer her husbands questions but not this RN. MD to bedside at this time to assess patient.

## 2017-04-19 NOTE — ED Notes (Signed)
NAD noted at time of D/C. Pt denies questions or concerns. Pt ambulatory to the lobby at this time. Pt requesting prescription for  Ibuprofen so that she doesn't have to take 3 pills and only has to take 1, per MD pt is okay to take OTC, explained to patient, pt rolled eyes and states "okay". Pt refused wheelchair to the lobby.

## 2017-04-19 NOTE — ED Notes (Signed)
AAOx3.  Skin warm and dry.  NAD.  Ambulated to BR.  Tolerated well.  Gait steady.  NAD.  Patient c/o palpitations.  No SOB/ DOE.  VS wnl.

## 2017-04-20 ENCOUNTER — Encounter: Payer: Self-pay | Admitting: Emergency Medicine

## 2017-04-20 ENCOUNTER — Emergency Department
Admission: EM | Admit: 2017-04-20 | Discharge: 2017-04-20 | Disposition: A | Discharge status: Home / Self Care | Payer: Self-pay | Attending: Emergency Medicine | Admitting: Emergency Medicine

## 2017-04-20 DIAGNOSIS — Z79899 Other long term (current) drug therapy: Secondary | ICD-10-CM | POA: Insufficient documentation

## 2017-04-20 DIAGNOSIS — Z7289 Other problems related to lifestyle: Secondary | ICD-10-CM | POA: Insufficient documentation

## 2017-04-20 DIAGNOSIS — Z765 Malingerer [conscious simulation]: Secondary | ICD-10-CM

## 2017-04-20 DIAGNOSIS — F419 Anxiety disorder, unspecified: Secondary | ICD-10-CM | POA: Insufficient documentation

## 2017-04-20 MED ORDER — HYDROXYZINE HCL 25 MG PO TABS: 50.0000 mg | ORAL_TABLET | Freq: Once | ORAL | Status: DC

## 2017-04-20 MED ORDER — HYDROXYZINE PAMOATE 50 MG PO CAPS: 50.0000 mg | ORAL_CAPSULE | Freq: Three times a day (TID) | ORAL | 0 refills | Status: DC | PRN

## 2017-04-20 MED ORDER — HYDROXYZINE HCL 25 MG PO TABS
ORAL_TABLET | ORAL | Status: AC
  Filled 2017-04-20: qty 2

## 2017-04-20 NOTE — ED Triage Notes (Signed)
Pt presents today with c/o anxiety, was seen here last night for same and was given lorazepam with some relief. Pt states she feels like she needs something different for her anxiety.

## 2017-04-20 NOTE — ED Notes (Signed)
ED Provider at bedside. 

## 2017-04-20 NOTE — ED Provider Notes (Signed)
[facility] Emergency Department Provider Note  ____________________________________________   First MD Initiated Contact with Patient 04/20/17 1439     (approximate)  I have reviewed the triage vital signs and the nursing notes.   HISTORY  Chief Complaint Anxiety    HPI Kayla Ball is a 51 y.o. female who self presents to the emergency department with anxiety. She seen in our emergency department yesterday given a dose of lorazepam and discharged home with a short course of lorazepam with Rh a follow-up. Yesterday the ER physician offered the patient a psychiatric consultation however she declined. She initially felt well until she got home and she began to feel more nervous and anxious. She attempted to make follow-up at [facility] but her appointment is not for about a week.   Past Medical History:  Diagnosis Date  . Anxiety   . IDA (iron deficiency anemia)   . Panic attacks     There are no active problems to display for this patient.   History reviewed. No pertinent surgical history.  Prior to Admission medications   Medication Sig Start Date End Date Taking? Authorizing Provider  benzonatate (TESSALON PERLES) 100 MG capsule Take 1 capsule (100 mg total) by mouth 3 (three) times daily as needed for cough (Take 1-2 per dose). Patient not taking: Reported on 04/19/2017 12/29/16   Menshew, Charlesetta Ivory, PA-C  brompheniramine-pseudoephedrine-DM 30-2-10 MG/5ML syrup Take 5 mLs by mouth 4 (four) times daily as needed. Patient not taking: Reported on 04/19/2017 12/29/16   Menshew, Charlesetta Ivory, PA-C  citalopram (CELEXA) 20 MG tablet Take 20 mg by mouth daily.    [provider]  clonazePAM (KLONOPIN) 0.5 MG tablet Take 0.5 mg by mouth 2 (two) times daily as needed for anxiety.    [provider]  cyclobenzaprine (FLEXERIL) 5 MG tablet Take 1 tablet (5 mg total) by mouth every 8 (eight) hours as needed for muscle spasms. Patient  not taking: Reported on 04/19/2017 10/16/15   Menshew, Charlesetta Ivory, PA-C  fluticasone (FLONASE) 50 MCG/ACT nasal spray Place 2 sprays into both nostrils daily. Patient not taking: Reported on 04/19/2017 12/29/16   Menshew, Charlesetta Ivory, PA-C  hydrOXYzine (VISTARIL) 50 MG capsule Take 1 capsule (50 mg total) by mouth 3 (three) times daily as needed for anxiety. 04/20/17   Merrily Brittle, MD  ketorolac (ACULAR) 0.5 % ophthalmic solution Place 1 drop into the left eye 4 (four) times daily. Patient not taking: Reported on 04/19/2017 05/19/16   Kem Boroughs B, FNP  LORazepam (ATIVAN) 0.5 MG tablet Take 1 tablet (0.5 mg total) by mouth every 8 (eight) hours as needed for anxiety. 04/19/17 04/19/18  Jeanmarie Plant, MD  naproxen (EC NAPROSYN) 500 MG EC tablet Take 1 tablet (500 mg total) by mouth 2 (two) times daily with a meal. Patient not taking: Reported on 04/19/2017 10/16/15   Menshew, Charlesetta Ivory, PA-C  predniSONE (DELTASONE) 10 MG tablet Take 1 tablet (10 mg total) by mouth 2 (two) times daily with a meal. Patient not taking: Reported on 04/19/2017 12/29/16   Menshew, Charlesetta Ivory, PA-C    Allergies Patient has no known allergies.  No family history on file.  Social History Social History  Substance Use Topics  . Smoking status: Never Smoker  . Smokeless tobacco: Never Used  . Alcohol use Yes    Review of Systems Constitutional: No fever/chills ENT: No sore throat. Cardiovascular: Denies chest pain. Respiratory: Denies shortness  of breath. Gastrointestinal: No abdominal pain.  No nausea, no vomiting.  No diarrhea.  No constipation. Musculoskeletal: Negative for back pain. Neurological: Negative for headaches   ____________________________________________   PHYSICAL EXAM:  VITAL SIGNS: ED Triage Vitals [04/20/17 1251]  Enc Vitals Group     BP 118/81     Pulse Rate 78     Resp 20     Temp 98.1 F (36.7 C)     Temp Source Oral     SpO2 100 %     Weight      Height        Head Circumference      Peak Flow      Pain Score      Pain Loc      Pain Edu?      Excl. in GC?     Constitutional: alert and oriented 4 appears somewhat anxious but overall well-appearing no diaphoresis Head: Atraumatic. Nose: No congestion/rhinnorhea. Mouth/Throat: No trismus Neck: No stridor.   Cardiovascular: regular rate and rhythm Respiratory: Normal respiratory effort.  No retractions. Gastrointestinal: soft nontender Neurologic:  Normal speech and language. No gross focal neurologic deficits are appreciated.  Skin:  Skin is warm, dry and intact. No rash noted.    ____________________________________________  LABS (all labs ordered are listed, but only abnormal results are displayed)  Labs Reviewed - No data to display   __________________________________________  EKG   ____________________________________________  RADIOLOGY   ____________________________________________   PROCEDURES  Procedure(s) performed: no  Procedures  Critical Care performed: no  Observation: no ____________________________________________   INITIAL IMPRESSION / ASSESSMENT AND PLAN / ED COURSE  Pertinent labs & imaging results that were available during my care of the patient were reviewed by me and considered in my medical decision making (see chart for details).  On arrival the patient is neurologically intact and not tremulous. Yesterday she had an exhaustive workup including a thyroid test which were all normal. I believe her symptoms to the psychiatric in etiology. As the lorazepam did not seem to help she agrees to try hydroxyzine.     ----------------------------------------- 3:09 PM on 04/20/2017 -----------------------------------------  The patient has numerous times requested intravenous lorazepam and is frustrated that I have not provided her antianxiety medications intravenously. I discussed with her that I did not believe she required IV medication  at this time and that I was more than happy to treat her anxiety now and is now outpatient with oral medications. ____________________________________________   FINAL CLINICAL IMPRESSION(S) / ED DIAGNOSES  Final diagnoses:  Anxiety  Drug-seeking behavior      NEW MEDICATIONS STARTED DURING THIS VISIT:  Discharge Medication List as of 04/20/2017  3:09 PM    START taking these medications   Details  hydrOXYzine (VISTARIL) 50 MG capsule Take 1 capsule (50 mg total) by mouth 3 (three) times daily as needed for anxiety., Starting Mon 04/20/2017, Print         Note:  This document was prepared using Dragon voice recognition software and may include unintentional dictation errors.      Merrily Brittle, MD 04/20/17 2234

## 2017-04-20 NOTE — Discharge Instructions (Signed)
Please make sure you follow up with his psychiatrist this week for a reevaluation. Return to the emergency department for any concerns.  It was a pleasure to take care of you today, and thank you for coming to our emergency department.  If you have any questions or concerns before leaving please ask the nurse to grab me and I'm more than happy to go through your aftercare instructions again.  If you were prescribed any opioid pain medication today such as Norco, Vicodin, Percocet, morphine, hydrocodone, or oxycodone please make sure you do not drive when you are taking this medication as it can alter your ability to drive safely.  If you have any concerns once you are home that you are not improving or are in fact getting worse before you can make it to your follow-up appointment, please do not hesitate to call 911 and come back for further evaluation.  Merrily Brittle, MD  Results for orders placed or performed during the hospital encounter of 04/19/17  Basic metabolic panel  Result Value Ref Range   Sodium 132 (L) 135 - 145 mmol/L   Potassium 3.4 (L) 3.5 - 5.1 mmol/L   Chloride 97 (L) 101 - 111 mmol/L   CO2 25 22 - 32 mmol/L   Glucose, Bld 86 65 - 99 mg/dL   BUN 8 6 - 20 mg/dL   Creatinine, Ser 4.09 0.44 - 1.00 mg/dL   Calcium 9.1 8.9 - 81.1 mg/dL   GFR calc non Af Amer >60 >60 mL/min   GFR calc Af Amer >60 >60 mL/min   Anion gap 10 5 - 15  CBC  Result Value Ref Range   WBC 5.5 3.6 - 11.0 K/uL   RBC 4.20 3.80 - 5.20 MIL/uL   Hemoglobin 12.9 12.0 - 16.0 g/dL   HCT 91.4 78.2 - 95.6 %   MCV 87.5 80.0 - 100.0 fL   MCH 30.7 26.0 - 34.0 pg   MCHC 35.1 32.0 - 36.0 g/dL   RDW 21.3 08.6 - 57.8 %   Platelets 332 150 - 440 K/uL  Troponin I  Result Value Ref Range   Troponin I <0.03 <0.03 ng/mL  Magnesium  Result Value Ref Range   Magnesium 1.8 1.7 - 2.4 mg/dL  TSH  Result Value Ref Range   TSH 1.140 0.350 - 4.500 uIU/mL   Dg Chest 2 View  Result Date: 04/19/2017 CLINICAL DATA:   Patient reports she feels as if her heart is racing. Also reports feeling dizzy. Denies SOB or CP. No known heart or lung conditions. Non-smoker. EXAM: CHEST  2 VIEW COMPARISON:  12/29/2016 FINDINGS: The heart size and mediastinal contours are within normal limits. Both lungs are clear. The visualized skeletal structures are unremarkable. IMPRESSION: No active cardiopulmonary disease. Electronically Signed   By: Norva Pavlov M.D.   On: 04/19/2017 11:32

## 2017-07-05 ENCOUNTER — Emergency Department: Payer: Self-pay

## 2017-07-05 ENCOUNTER — Emergency Department
Admission: EM | Admit: 2017-07-05 | Discharge: 2017-07-05 | Disposition: A | Discharge status: Home / Self Care | Payer: Self-pay | Attending: Emergency Medicine | Admitting: Emergency Medicine

## 2017-07-05 ENCOUNTER — Other Ambulatory Visit: Payer: Self-pay

## 2017-07-05 DIAGNOSIS — Y999 Unspecified external cause status: Secondary | ICD-10-CM | POA: Insufficient documentation

## 2017-07-05 DIAGNOSIS — M25512 Pain in left shoulder: Secondary | ICD-10-CM | POA: Insufficient documentation

## 2017-07-05 DIAGNOSIS — S161XXA Strain of muscle, fascia and tendon at neck level, initial encounter: Secondary | ICD-10-CM | POA: Insufficient documentation

## 2017-07-05 DIAGNOSIS — Y939 Activity, unspecified: Secondary | ICD-10-CM | POA: Insufficient documentation

## 2017-07-05 DIAGNOSIS — Z79899 Other long term (current) drug therapy: Secondary | ICD-10-CM | POA: Insufficient documentation

## 2017-07-05 DIAGNOSIS — W010XXA Fall on same level from slipping, tripping and stumbling without subsequent striking against object, initial encounter: Secondary | ICD-10-CM | POA: Insufficient documentation

## 2017-07-05 DIAGNOSIS — Y929 Unspecified place or not applicable: Secondary | ICD-10-CM | POA: Insufficient documentation

## 2017-07-05 MED ORDER — CYCLOBENZAPRINE HCL 10 MG PO TABS
10.0000 mg | ORAL_TABLET | Freq: Once | ORAL | Status: AC
  Administered 2017-07-05: 10 mg via ORAL
  Filled 2017-07-05: qty 1

## 2017-07-05 MED ORDER — NAPROXEN 500 MG PO TABS: 500.0000 mg | ORAL_TABLET | Freq: Two times a day (BID) | ORAL | Status: DC

## 2017-07-05 MED ORDER — CYCLOBENZAPRINE HCL 10 MG PO TABS: 10.0000 mg | ORAL_TABLET | Freq: Three times a day (TID) | ORAL | 0 refills | Status: DC | PRN

## 2017-07-05 MED ORDER — TRAMADOL HCL 50 MG PO TABS
50.0000 mg | ORAL_TABLET | Freq: Once | ORAL | Status: AC
  Administered 2017-07-05: 50 mg via ORAL
  Filled 2017-07-05: qty 1

## 2017-07-05 MED ORDER — ONDANSETRON 8 MG PO TBDP
8.0000 mg | ORAL_TABLET | Freq: Once | ORAL | Status: AC
  Administered 2017-07-05: 8 mg via ORAL
  Filled 2017-07-05: qty 1

## 2017-07-05 MED ORDER — TRAMADOL HCL 50 MG PO TABS: 50.0000 mg | ORAL_TABLET | Freq: Four times a day (QID) | ORAL | 0 refills | Status: DC | PRN

## 2017-07-05 NOTE — ED Provider Notes (Signed)
[facility] Emergency Department Provider Note   ____________________________________________   First MD Initiated Contact with Patient 07/05/17 1837     (approximate)  I have reviewed the triage vital signs and the nursing notes.   HISTORY  Chief Complaint Fall and Shoulder Pain    HPI Kayla Ball is a 51 y.o. female patient complaining of neck and upper back and left shoulder pain secondary to a fall last night. Patient denies radicular component to her neck pain. Patient denies numbness/tingling. Patient stated pain increases with flexion of the neck. Patient also state that anterior shoulder pain which increased with adduction and overhead reaching. Patient denies loss of sensation. Patient state taking ibuprofen with mild relief. Patient rates pain as a 7/10. Patient described a pain as "achy".   Past Medical History:  Diagnosis Date  . Anxiety   . IDA (iron deficiency anemia)   . Panic attacks     There are no active problems to display for this patient.   No past surgical history on file.  Prior to Admission medications   Medication Sig Start Date End Date Taking? Authorizing Provider  benzonatate (TESSALON PERLES) 100 MG capsule Take 1 capsule (100 mg total) by mouth 3 (three) times daily as needed for cough (Take 1-2 per dose). Patient not taking: Reported on 04/19/2017 12/29/16   Menshew, Charlesetta IvoryJenise V Bacon, PA-C  brompheniramine-pseudoephedrine-DM 30-2-10 MG/5ML syrup Take 5 mLs by mouth 4 (four) times daily as needed. Patient not taking: Reported on 04/19/2017 12/29/16   Menshew, Charlesetta IvoryJenise V Bacon, PA-C  citalopram (CELEXA) 20 MG tablet Take 20 mg by mouth daily.    [provider]  clonazePAM (KLONOPIN) 0.5 MG tablet Take 0.5 mg by mouth 2 (two) times daily as needed for anxiety.    [provider]  cyclobenzaprine (FLEXERIL) 10 MG tablet Take 1 tablet (10 mg total) by mouth 3 (three) times daily as needed. 07/05/17    Joni ReiningSmith, Mekiah Cambridge K, PA-C  cyclobenzaprine (FLEXERIL) 5 MG tablet Take 1 tablet (5 mg total) by mouth every 8 (eight) hours as needed for muscle spasms. Patient not taking: Reported on 04/19/2017 10/16/15   Menshew, Charlesetta IvoryJenise V Bacon, PA-C  fluticasone (FLONASE) 50 MCG/ACT nasal spray Place 2 sprays into both nostrils daily. Patient not taking: Reported on 04/19/2017 12/29/16   Menshew, Charlesetta IvoryJenise V Bacon, PA-C  hydrOXYzine (VISTARIL) 50 MG capsule Take 1 capsule (50 mg total) by mouth 3 (three) times daily as needed for anxiety. 04/20/17   Merrily Brittleifenbark, Neil, MD  ketorolac (ACULAR) 0.5 % ophthalmic solution Place 1 drop into the left eye 4 (four) times daily. Patient not taking: Reported on 04/19/2017 05/19/16   Kem Boroughsriplett, Cari B, FNP  LORazepam (ATIVAN) 0.5 MG tablet Take 1 tablet (0.5 mg total) by mouth every 8 (eight) hours as needed for anxiety. 04/19/17 04/19/18  Jeanmarie PlantMcShane, James A, MD  naproxen (EC NAPROSYN) 500 MG EC tablet Take 1 tablet (500 mg total) by mouth 2 (two) times daily with a meal. Patient not taking: Reported on 04/19/2017 10/16/15   Menshew, Charlesetta IvoryJenise V Bacon, PA-C  naproxen (NAPROSYN) 500 MG tablet Take 1 tablet (500 mg total) by mouth 2 (two) times daily with a meal. 07/05/17   Joni ReiningSmith, Alianah Lofton K, PA-C  predniSONE (DELTASONE) 10 MG tablet Take 1 tablet (10 mg total) by mouth 2 (two) times daily with a meal. Patient not taking: Reported on 04/19/2017 12/29/16   Menshew, Charlesetta IvoryJenise V Bacon, PA-C  traMADol (ULTRAM) 50 MG tablet Take  1 tablet (50 mg total) by mouth every 6 (six) hours as needed for moderate pain. 07/05/17   Joni ReiningSmith, Gaige Sebo K, PA-C    Allergies Patient has no known allergies.  No family history on file.  Social History Social History   Tobacco Use  . Smoking status: Never Smoker  . Smokeless tobacco: Never Used  Substance Use Topics  . Alcohol use: Yes  . Drug use: No    Review of Systems Constitutional: No fever/chills Eyes: No visual changes. ENT: No sore throat. Cardiovascular:  Denies chest pain. Respiratory: Denies shortness of breath. Gastrointestinal: No abdominal pain.  No nausea, no vomiting.  No diarrhea.  No constipation. Genitourinary: Negative for dysuria. Musculoskeletal: Neck and left shoulder pain. Skin: Negative for rash. Neurological: Negative for headaches, focal weakness or numbness. Psychiatric:Anxiety with panic attacks ____________________________________________   PHYSICAL EXAM:  VITAL SIGNS: ED Triage Vitals  Enc Vitals Group     BP 07/05/17 1730 118/80     Pulse Rate 07/05/17 1730 80     Resp 07/05/17 1730 16     Temp 07/05/17 1730 98.4 F (36.9 C)     Temp Source 07/05/17 1730 Oral     SpO2 07/05/17 1730 99 %     Weight 07/05/17 1730 123 lb (55.8 kg)     Height 07/05/17 1730 5\' 4"  (1.626 m)     Head Circumference --      Peak Flow --      Pain Score 07/05/17 1734 7     Pain Loc --      Pain Edu? --      Excl. in GC? --    Constitutional: Alert and oriented. Well appearing and in no acute distress. Neck: No stridor.  No cervical spine tenderness to palpation. Patient range of motion with flexion Hematological/Lymphatic/Immunilogical: No cervical lymphadenopathy. Cardiovascular: Normal rate, regular rhythm. Grossly normal heart sounds.  Good peripheral circulation. Respiratory: Normal respiratory effort.  No retractions. Lungs CTAB. Musculoskeletal: No obvious shoulder deformity. Patient will follow-up [place] and has a as examine her right shoulder. Patient has some moderate guarding at the [facility]. Patient demonstrates limited range of motion for abduction overhead reaching and limited by complaining of pain.  Neurologic:  Normal speech and language. No gross focal neurologic deficits are appreciated. No gait instability. Skin:  Skin is warm, dry and intact. No rash noted. Psychiatric: Mood and affect are normal. Speech and behavior are normal.  ____________________________________________   LABS (all labs ordered are  listed, but only abnormal results are displayed)  Labs Reviewed - No data to display ____________________________________________  EKG   ____________________________________________  RADIOLOGY  Dg Cervical Spine 2-3 Views  Result Date: 07/05/2017 CLINICAL DATA:  Pain following fall EXAM: CERVICAL SPINE - 2-3 VIEW COMPARISON:  None. FINDINGS: Frontal, lateral, and spot lumbosacral lateral images were obtained. There is no fracture or spondylolisthesis. Prevertebral soft tissues and predental space regions are normal. Disc spaces appear normal. There is a small focus of calcification in the anterior ligament at C5-6. Lung apices are clear. There is mild upper thoracic dextroscoliosis. IMPRESSION: No fracture or spondylolisthesis. No appreciable joint space narrowing. Electronically Signed   By: Bretta BangWilliam  Woodruff III M.D.   On: 07/05/2017 19:09   Dg Shoulder Left  Result Date: 07/05/2017 CLINICAL DATA:  Pain following fall EXAM: LEFT SHOULDER - 2+ VIEW COMPARISON:  None. FINDINGS: Frontal, Y scapular, and axillary images were obtained. No fracture or dislocation. Joint spaces appear normal. No erosive change. IMPRESSION: No fracture  or dislocation.  No evident arthropathy. Electronically Signed   By: Bretta Bang III M.D.   On: 07/05/2017 19:10    ____________________________________________   PROCEDURES  Procedure(s) performed: None  Procedures  Critical Care performed: No  ____________________________________________   INITIAL IMPRESSION / ASSESSMENT AND PLAN / ED COURSE  As part of my medical decision making, I reviewed the following data within the electronic MEDICAL RECORD NUMBER    Cervical strain and left shoulder pain secondary to fall. Discussed negative x-ray findings with patient. Patient given discharge instructions. Is advised to take medication as directed follow up with PCP if no improvement in 2-3 days.     ____________________________________________   FINAL CLINICAL IMPRESSION(S) / ED DIAGNOSES  Final diagnoses:  Cervical strain, acute, initial encounter  Acute pain of left shoulder     ED Discharge Orders        Ordered    traMADol (ULTRAM) 50 MG tablet  Every 6 hours PRN     07/05/17 1926    cyclobenzaprine (FLEXERIL) 10 MG tablet  3 times daily PRN     07/05/17 1926    naproxen (NAPROSYN) 500 MG tablet  2 times daily with meals     07/05/17 1926       Note:  This document was prepared using Dragon voice recognition software and may include unintentional dictation errors.    Joni Reining, PA-C 07/05/17 1929    Arnaldo Natal, MD 07/05/17 2009

## 2017-07-05 NOTE — ED Notes (Signed)
See triage note  States she slipped in the snow yesterday   Having pain to left shoulder  Having increased pain with movement

## 2017-07-05 NOTE — ED Triage Notes (Signed)
Pt states that she slipped and fell in the snow yesterday, pt states that a few hours later she started hurting in her left shoulder when she moves, pt states that last night her back, and left side started hurting.

## 2017-07-26 ENCOUNTER — Emergency Department
Admission: EM | Admit: 2017-07-26 | Discharge: 2017-07-26 | Disposition: A | Discharge status: Home / Self Care | Payer: Self-pay | Attending: Emergency Medicine | Admitting: Emergency Medicine

## 2017-07-26 ENCOUNTER — Encounter: Payer: Self-pay | Admitting: Intensive Care

## 2017-07-26 DIAGNOSIS — W19XXXD Unspecified fall, subsequent encounter: Secondary | ICD-10-CM | POA: Insufficient documentation

## 2017-07-26 DIAGNOSIS — Z79899 Other long term (current) drug therapy: Secondary | ICD-10-CM | POA: Insufficient documentation

## 2017-07-26 DIAGNOSIS — R519 Headache, unspecified: Secondary | ICD-10-CM

## 2017-07-26 DIAGNOSIS — M25512 Pain in left shoulder: Secondary | ICD-10-CM | POA: Insufficient documentation

## 2017-07-26 DIAGNOSIS — R51 Headache: Secondary | ICD-10-CM | POA: Insufficient documentation

## 2017-07-26 NOTE — ED Notes (Signed)
Pt presents today for a  HA r/t fall. Pt was seen here 2 wks ago and states her head was not scanned. Pt ambulatory and A/Ox4 Family at bedside with EDP.

## 2017-07-26 NOTE — ED Provider Notes (Signed)
[facility] Emergency Department Provider Note    ____________________________________________   I have reviewed the triage vital signs and the nursing notes.   HISTORY  Chief Complaint Headache and Fall   History limited by: Not Limited   HPI Kayla Ball is a 52 y.o. female who presents to the emergency department today because of concern for headache.   LOCATION:generalized DURATION:3 weeks TIMING: waxing and waning QUALITY: ache SEVERITY: currently mild CONTEXT: patient had a fall 3 weeks ago. States that she fell backwards in the snow. Now cannot remember if she passed out. Was seen in the ER but no head imaging done at that time. Since then has been having headaches. MODIFYING FACTORS: none ASSOCIATED SYMPTOMS: has felt drowsy. Has been having difficulty with sleeping.  Per medical record review patient has a history of ER visit three weeks ago with negative left shoulder and cervical spine x-ray.  Past Medical History:  Diagnosis Date  . Anxiety   . IDA (iron deficiency anemia)   . Panic attacks     There are no active problems to display for this patient.   History reviewed. No pertinent surgical history.  Prior to Admission medications   Medication Sig Start Date End Date Taking? Authorizing Provider  benzonatate (TESSALON PERLES) 100 MG capsule Take 1 capsule (100 mg total) by mouth 3 (three) times daily as needed for cough (Take 1-2 per dose). Patient not taking: Reported on 04/19/2017 12/29/16   Menshew, Charlesetta Ivory, PA-C  brompheniramine-pseudoephedrine-DM 30-2-10 MG/5ML syrup Take 5 mLs by mouth 4 (four) times daily as needed. Patient not taking: Reported on 04/19/2017 12/29/16   Menshew, Charlesetta Ivory, PA-C  citalopram (CELEXA) 20 MG tablet Take 20 mg by mouth daily.    [provider]  clonazePAM (KLONOPIN) 0.5 MG tablet Take 0.5 mg by mouth 2 (two) times daily as needed for anxiety.    [provider]  cyclobenzaprine (FLEXERIL) 10 MG tablet Take 1 tablet (10 mg total) by mouth 3 (three) times daily as needed. 07/05/17   Joni Reining, PA-C  cyclobenzaprine (FLEXERIL) 5 MG tablet Take 1 tablet (5 mg total) by mouth every 8 (eight) hours as needed for muscle spasms. Patient not taking: Reported on 04/19/2017 10/16/15   Menshew, Charlesetta Ivory, PA-C  fluticasone (FLONASE) 50 MCG/ACT nasal spray Place 2 sprays into both nostrils daily. Patient not taking: Reported on 04/19/2017 12/29/16   Menshew, Charlesetta Ivory, PA-C  hydrOXYzine (VISTARIL) 50 MG capsule Take 1 capsule (50 mg total) by mouth 3 (three) times daily as needed for anxiety. 04/20/17   Merrily Brittle, MD  ketorolac (ACULAR) 0.5 % ophthalmic solution Place 1 drop into the left eye 4 (four) times daily. Patient not taking: Reported on 04/19/2017 05/19/16   Kem Boroughs B, FNP  LORazepam (ATIVAN) 0.5 MG tablet Take 1 tablet (0.5 mg total) by mouth every 8 (eight) hours as needed for anxiety. 04/19/17 04/19/18  Jeanmarie Plant, MD  naproxen (EC NAPROSYN) 500 MG EC tablet Take 1 tablet (500 mg total) by mouth 2 (two) times daily with a meal. Patient not taking: Reported on 04/19/2017 10/16/15   Menshew, Charlesetta Ivory, PA-C  naproxen (NAPROSYN) 500 MG tablet Take 1 tablet (500 mg total) by mouth 2 (two) times daily with a meal. 07/05/17   Joni Reining, PA-C  predniSONE (DELTASONE) 10 MG tablet Take 1 tablet (10 mg total) by mouth 2 (two) times daily with a meal. Patient  not taking: Reported on 04/19/2017 12/29/16   Menshew, Charlesetta IvoryJenise V Bacon, PA-C  traMADol (ULTRAM) 50 MG tablet Take 1 tablet (50 mg total) by mouth every 6 (six) hours as needed for moderate pain. 07/05/17   Joni ReiningSmith, Ronald K, PA-C    Allergies Patient has no known allergies.  History reviewed. No pertinent family history.  Social History Social History   Tobacco Use  . Smoking status: Never Smoker  . Smokeless tobacco: Never Used  Substance Use Topics  .  Alcohol use: Yes    Comment: occ  . Drug use: No    Review of Systems Constitutional: No fever/chills Eyes: No visual changes. ENT: No sore throat. Cardiovascular: Denies chest pain. Respiratory: Denies shortness of breath. Gastrointestinal: No abdominal pain.  No nausea, no vomiting.  No diarrhea.   Genitourinary: Negative for dysuria. Musculoskeletal: Positive for left shoulder pain. Skin: Negative for rash. Neurological: Positive for headache.  ____________________________________________   PHYSICAL EXAM:  VITAL SIGNS: ED Triage Vitals [07/26/17 0917]  Enc Vitals Group     BP (!) 138/97     Pulse Rate 75     Resp 14     Temp 98.1 F (36.7 C)     Temp Source Oral     SpO2 100 %     Weight 124 lb (56.2 kg)     Height 5\' 4"  (1.626 m)   Constitutional: Alert and oriented. Well appearing and in no distress. Eyes: Conjunctivae are normal.  ENT   Head: Normocephalic and atraumatic.   Nose: No congestion/rhinnorhea.   Mouth/Throat: Mucous membranes are moist.   Neck: No stridor. Hematological/Lymphatic/Immunilogical: No cervical lymphadenopathy. Cardiovascular: Normal rate, regular rhythm.  No murmurs, rubs, or gallops.  Respiratory: Normal respiratory effort without tachypnea nor retractions. Breath sounds are clear and equal bilaterally. No wheezes/rales/rhonchi. Gastrointestinal: Soft and non tender. No rebound. No guarding.  Genitourinary: Deferred Musculoskeletal: Normal range of motion in all extremities. No lower extremity edema. Neurologic:  Normal speech and language. Face symmetric. Tongue midline. Strength 5/5 in upper and lower extremities. Sensation intact. Finger to nose normal. Ambulation normal. No gross focal neurologic deficits are appreciated.  Skin:  Skin is warm, dry and intact. No rash noted. Psychiatric: Mood and affect are normal. Speech and behavior are normal. Patient exhibits appropriate insight and  judgment.  ____________________________________________    LABS (pertinent positives/negatives)  None  ____________________________________________   EKG  None  ____________________________________________    RADIOLOGY  None  ____________________________________________   PROCEDURES  Procedures  ____________________________________________   INITIAL IMPRESSION / ASSESSMENT AND PLAN / ED COURSE  Pertinent labs & imaging results that were available during my care of the patient were reviewed by me and considered in my medical decision making (see chart for details).  Patient presented to the er because of concern for headache after fall three weeks ago. Had a discussion with patient and family about pros and cons of neuroimaging. Discussed risks of cancer of CT scan and low likely hood of significant finding given 3 weeks since the fall. Did discuss even if there was small bleed do not think any intervention would be warranted. Neuro exam is intact. Patient felt comfortable deferring CT scan. Did offer to perform if she felt strongly she wanted one done.  Think more likely patient suffered from concussion and could be having post concussion syndrome. Did offer medication for headache which patient decline. Discussed brain rest with patient and family. Discussed importance of follow up with primary care physician.  ____________________________________________   FINAL CLINICAL IMPRESSION(S) / ED DIAGNOSES  Final diagnoses:  Fall, subsequent encounter  Nonintractable headache, unspecified chronicity pattern, unspecified headache type  Acute pain of left shoulder     Note: This dictation was prepared with Dragon dictation. Any transcriptional errors that result from this process are unintentional     Phineas Semen, MD 07/26/17 [phone]

## 2017-07-26 NOTE — Discharge Instructions (Signed)
Please seek medical attention for any high fevers, chest pain, shortness of breath, change in behavior, persistent vomiting, bloody stool or any other new or concerning symptoms.  

## 2017-07-26 NOTE — ED Notes (Signed)
ED Provider at bedside. 

## 2017-07-26 NOTE — ED Triage Notes (Signed)
Patient reports being here two weeks ago for fall but never had her head scanned. Reports headaches ever since and unable to sleep at night. Denies emesis but is having nausea. Ambulatory in triage with no problems. A&O x4. Patient is unsure if she hit her head during fall two weeks ago

## 2017-07-26 NOTE — ED Notes (Signed)
FIRST NURSE NOTE:  Pt states she was here several weeks ago after fall, c/o headache and dizziness. States no CT scan was done. Pt ambulatory in triage.

## 2018-09-21 ENCOUNTER — Other Ambulatory Visit: Payer: Self-pay | Admitting: Family Medicine

## 2018-09-21 DIAGNOSIS — R1900 Intra-abdominal and pelvic swelling, mass and lump, unspecified site: Secondary | ICD-10-CM

## 2018-10-01 ENCOUNTER — Ambulatory Visit: Payer: Medicaid Other

## 2018-10-08 ENCOUNTER — Ambulatory Visit: Payer: Medicaid Other

## 2018-11-09 ENCOUNTER — Ambulatory Visit: Payer: Medicaid Other

## 2018-12-20 ENCOUNTER — Ambulatory Visit: Admission: RE | Admit: 2018-12-20 | Payer: Medicaid Other | Source: Ambulatory Visit

## 2019-01-09 ENCOUNTER — Ambulatory Visit: Payer: Medicaid Other

## 2019-01-09 ENCOUNTER — Telehealth: Payer: Self-pay | Admitting: Internal Medicine

## 2019-01-09 ENCOUNTER — Ambulatory Visit
Admission: EM | Admit: 2019-01-09 | Discharge: 2019-01-09 | Disposition: A | Discharge status: Home / Self Care | Payer: Medicaid Other | Attending: Emergency Medicine | Admitting: Emergency Medicine

## 2019-01-09 ENCOUNTER — Other Ambulatory Visit: Payer: Self-pay

## 2019-01-09 DIAGNOSIS — M791 Myalgia, unspecified site: Secondary | ICD-10-CM

## 2019-01-09 DIAGNOSIS — R0602 Shortness of breath: Secondary | ICD-10-CM

## 2019-01-09 DIAGNOSIS — R300 Dysuria: Secondary | ICD-10-CM

## 2019-01-09 DIAGNOSIS — R6889 Other general symptoms and signs: Secondary | ICD-10-CM

## 2019-01-09 DIAGNOSIS — Z20822 Contact with and (suspected) exposure to covid-19: Secondary | ICD-10-CM

## 2019-01-09 DIAGNOSIS — R05 Cough: Secondary | ICD-10-CM | POA: Diagnosis not present

## 2019-01-09 DIAGNOSIS — R51 Headache: Secondary | ICD-10-CM

## 2019-01-09 LAB — URINALYSIS, COMPLETE (UACMP) WITH MICROSCOPIC
Bacteria, UA: NONE SEEN
Bilirubin Urine: NEGATIVE
Glucose, UA: NEGATIVE mg/dL
Ketones, ur: NEGATIVE mg/dL
Leukocytes,Ua: NEGATIVE
Nitrite: NEGATIVE
Protein, ur: NEGATIVE mg/dL
Specific Gravity, Urine: <1.005 — ABNORMAL LOW (ref 1.005–1.030)
WBC, UA: NONE SEEN WBC/hpf (ref 0–5)
pH: 5.5 (ref 5.0–8.0)

## 2019-01-09 MED ORDER — AEROCHAMBER PLUS MISC: 2 refills | Status: AC

## 2019-01-09 MED ORDER — BENZONATATE 200 MG PO CAPS: 200.0000 mg | ORAL_CAPSULE | Freq: Three times a day (TID) | ORAL | 0 refills | Status: DC | PRN

## 2019-01-09 MED ORDER — ACETAMINOPHEN 500 MG PO TABS
1000.0000 mg | ORAL_TABLET | Freq: Once | ORAL | Status: AC
  Administered 2019-01-09: 1000 mg via ORAL

## 2019-01-09 NOTE — Telephone Encounter (Signed)
Using [employer] Interpreter Beverlee Nims called pt to get pt covid-19 testing done.  Appt scheduled at [facility] 01/10/19 at [place]

## 2019-01-09 NOTE — Telephone Encounter (Signed)
-----   Message from Melynda Ripple, MD sent at 01/09/2019  1:54 PM EDT ----- Regarding: needs covid testing Body aches chills cough shortness of breath loss of taste sense and smell, for 1 week. negative self swab about 2 weeks ago, but this was done before she was symptomatic.  Has had exposure that has tested positive for COVID. needs repeat COVID testing.

## 2019-01-09 NOTE — ED Triage Notes (Signed)
Pt has been feeling bad for 1 week. Did see her pcp on Wednesday and did get blood drawn. Was checked for Covid 19 2 weeks ago and was negative. But did have postitive exposure. Complains of fatigue, back pain, chest pain and cough. Body aches all over. Loss of taste and has been pushing fluids.

## 2019-01-09 NOTE — Discharge Instructions (Signed)
When should be contacting you today or tomorrow to arrange repeat COVID testing.  In the meantime, you must self isolate until you know what your COVID testing results are and you have been without a fever/respiratory symptoms for at least 3 days without the use of Tylenol.  Take 1 g of Tylenol 3-4 times a day as needed for pain.  The Tessalon should help with the cough. 2 Puffs from your albuterol inhaler using your spacer 3 or 4 times a day as needed for chest pain, shortness of breath, wheezing.  Your EKG was unremarkable, chest x-ray negative for pneumonia, urine negative for UTI.  I suspect your chest pain is from all of your coughing.

## 2019-01-09 NOTE — ED Provider Notes (Addendum)
HPI  SUBJECTIVE:  Kayla Ball is a 53 y.o. female who presents with 1 week of headache, body aches, chills, nonproductive cough, shortness of breath, decreased appetite, sharp intermittent, minutes long chest and upper back pain that is occurring daily.  She reports loss of taste and smell, sore throat.  Some stomach pain with nausea this morning, this has resolved.  She reports occasional nausea and palpitations, states that the chest pain sometimes goes down her right arm.  It does not go up to her neck or left arm.  Chest pain has no exertional or positional component.  It gets better with exertion and with rubbing her right arm, no aggravating factors.  She denies diaphoresis, belching, water brash, burning chest pain.  She also reports dysuria.  No urgency, frequency, cloudy or odorous urine, hematuria.  No diarrhea.  No neck stiffness, photophobia.  She saw her PMD 4 days ago for body aches, fatigue, and had blood work including  CBC, rheumatoid factor, uric acid ANA, cyclic citrulline peptide sent off.  This was all normal.  She was referred to rheumatology.  She has been using her inhaler for shortness of breath with improvement in this.  She is been taking aspirin twice a day and drinking tea.  She has also tried ibuprofen which helps.  Last dose of aspirin was within 6 hours of evaluation.  No aggravating factors.  She states that she has had a known COVID-19 exposure for several days prior to becoming ill.  States that she did a self swab before she became symptomatic and states that it came back negative.  Past medical history negative for GERD, asthma, emphysema, COPD, smoking.  No history of UTI, pyelonephritis.  No history of MI, coronary artery disease, diabetes, hypertension, hypercholesterolemia.  Family history negative for early MI.  PMD: Dr. Zada Finderslmedo   Past Medical History:  Diagnosis Date  . Anxiety   . IDA (iron deficiency anemia)   . Panic attacks     History  reviewed. No pertinent surgical history.  Family History  Family history unknown: Yes    Social History   Tobacco Use  . Smoking status: Never Smoker  . Smokeless tobacco: Never Used  Substance Use Topics  . Alcohol use: Yes    Comment: occ  . Drug use: No    No current facility-administered medications for this encounter.   Current Outpatient Medications:  .  citalopram (CELEXA) 20 MG tablet, Take 20 mg by mouth daily., Disp: , Rfl:  .  clonazePAM (KLONOPIN) 0.5 MG tablet, Take 0.5 mg by mouth 2 (two) times daily as needed for anxiety., Disp: , Rfl:  .  benzonatate (TESSALON) 200 MG capsule, Take 1 capsule (200 mg total) by mouth 3 (three) times daily as needed for cough., Disp: 30 capsule, Rfl: 0 .  cyclobenzaprine (FLEXERIL) 10 MG tablet, Take 1 tablet (10 mg total) by mouth 3 (three) times daily as needed., Disp: 15 tablet, Rfl: 0 .  fluticasone (FLONASE) 50 MCG/ACT nasal spray, Place 2 sprays into both nostrils daily. (Patient not taking: Reported on 04/19/2017), Disp: 16 g, Rfl: 0 .  hydrOXYzine (VISTARIL) 50 MG capsule, Take 1 capsule (50 mg total) by mouth 3 (three) times daily as needed for anxiety., Disp: 30 capsule, Rfl: 0 .  Spacer/Aero-Holding Chambers (AEROCHAMBER PLUS) inhaler, Use as instructed, Disp: 1 each, Rfl: 2  No Known Allergies   ROS  As noted in HPI.   Physical Exam  BP (!) 115/102 (BP Location:  Right Arm)   Pulse 70   Temp 98.4 F (36.9 C) (Oral)   Resp 18   Wt 72.6 kg   SpO2 100%   BMI 27.46 kg/m   Constitutional: Well developed, well nourished, appears ill. Eyes: PERRL, EOMI, conjunctiva normal bilaterally HENT: Normocephalic, atraumatic,mucus membranes moist.  Mild nasal congestion.  No sinus tenderness Neck: No cervical lymphadenopathy, meningismus. Respiratory: Poor respiratory effort, no appreciable  rales, no wheezing, no rhonchi.  Positive anterior and lateral chest wall tenderness Cardiovascular: Normal rate and rhythm, no murmurs,  no gallops, no rubs GI: Soft, nondistended, normal bowel sounds, nontender, no rebound, no guarding.  Negative Murphy, negative McBurney. Back: no CVAT skin: No rash, skin intact Musculoskeletal: No edema, no tenderness, no deformities Neurologic: Alert & oriented x 3, CN II-XII grossly intact, no motor deficits, sensation grossly intact Psychiatric: Speech and behavior appropriate   ED Course   Medications  acetaminophen (TYLENOL) tablet 1,000 mg (1,000 mg Oral Given 01/09/19 1426)    Orders Placed This Encounter  Procedures  . DG Chest 2 View    Standing Status:   Standing    Number of Occurrences:   1    Order Specific Question:   Reason for Exam (SYMPTOM  OR DIAGNOSIS REQUIRED)    Answer:   cough CP SOB r/o PNA  . Urinalysis, Complete w Microscopic    Standing Status:   Standing    Number of Occurrences:   1  . ED EKG    Standing Status:   Standing    Number of Occurrences:   1    Order Specific Question:   Reason for Exam    Answer:   Chest Pain  . EKG 12-Lead    Standing Status:   Standing    Number of Occurrences:   1  . EKG 12-Lead    Standing Status:   Standing    Number of Occurrences:   1   Results for orders placed or performed during the hospital encounter of 01/09/19 (from the past 24 hour(s))  Urinalysis, Complete w Microscopic     Status: Abnormal   Collection Time: 01/09/19  2:04 PM  Result Value Ref Range   Color, Urine STRAW (A) YELLOW   APPearance CLEAR CLEAR   Specific Gravity, Urine <1.005 (L) 1.005 - 1.030   pH 5.5 5.0 - 8.0   Glucose, UA NEGATIVE NEGATIVE mg/dL   Hgb urine dipstick TRACE (A) NEGATIVE   Bilirubin Urine NEGATIVE NEGATIVE   Ketones, ur NEGATIVE NEGATIVE mg/dL   Protein, ur NEGATIVE NEGATIVE mg/dL   Nitrite NEGATIVE NEGATIVE   Leukocytes,Ua NEGATIVE NEGATIVE   Squamous Epithelial / LPF 0-5 0 - 5   WBC, UA NONE SEEN 0 - 5 WBC/hpf   RBC / HPF 0-5 0 - 5 RBC/hpf   Bacteria, UA NONE SEEN NONE SEEN   Dg Chest 2 View  Result  Date: 01/09/2019 CLINICAL DATA:  Acute cough, chest pain and shortness of breath. EXAM: CHEST - 2 VIEW COMPARISON:  04/19/2017 chest radiograph prior studies FINDINGS: The cardiomediastinal silhouette is unremarkable. There is no evidence of focal airspace disease, pulmonary edema, suspicious pulmonary nodule/mass, pleural effusion, or pneumothorax. No acute bony abnormalities are identified. IMPRESSION: No active cardiopulmonary disease. Electronically Signed   By: Margarette Canada M.D.   On: 01/09/2019 14:17    ED Clinical Impression  1. Suspected Covid-19 Virus Infection      ED Assessment/Plan  outside records, labs reviewed.  As noted in HPI.  Suspect  COVID infection.  Will order repeat COVID testing.  [facility]  However she does report dysuria, so we will check a UA.  Chest x-ray for the cough and chest pain rule out pneumonia and EKG due to the chest pain  to rule out cardiac ischemia.  Suspect chest pain is musculoskeletal, it is very reproducible he has been doing a lot of coughing.  We will send her home with a spacer.  Giving 1 g of Tylenol.  Will reevaluate.  Reviewed imaging independently.  No Pneumonia. See radiology report for full details.  EKG: Sinus bradycardia rate 56.  Normal axis, normal intervals.  No hypertrophy.  No ST elevation. No Change compared to EKG from September 2018.  UA negative for UTI.  We will have her start 1 g of Tylenol 3-4 times a day as needed for headache, fever, push fluids.  Discontinue NSAIDs.  Self isolate until COVID testing complete.  Work note written.  May use albuterol 2 puffs 3-4 times a day with a spacer that I will prescribe to her as needed for shortness of breath.  Tessalon for the cough.  Follow-up with PMD in 1 week if not getting any better, to the ER if she gets worse.  Discussed labs, imaging, MDM, treatment plan, and plan for follow-up with patient Discussed sn/sx that should prompt return to the ED. patient  agrees with plan.   Meds ordered this encounter  Medications  . acetaminophen (TYLENOL) tablet 1,000 mg  . Spacer/Aero-Holding Chambers (AEROCHAMBER PLUS) inhaler    Sig: Use as instructed    Dispense:  1 each    Refill:  2  . benzonatate (TESSALON) 200 MG capsule    Sig: Take 1 capsule (200 mg total) by mouth 3 (three) times daily as needed for cough.    Dispense:  30 capsule    Refill:  0    *This clinic note was created using Scientist, clinical (histocompatibility and immunogenetics)Dragon dictation software. Therefore, there may be occasional mistakes despite careful proofreading.  ?[id number]- addendum- pt denies known tick bite.   Domenick GongMortenson, Marcelle Hepner, MD 01/09/19 1524    Domenick GongMortenson, Jivan Symanski, MD 01/09/19 [phone]

## 2019-01-10 ENCOUNTER — Other Ambulatory Visit: Payer: Medicaid Other

## 2019-01-10 DIAGNOSIS — Z20822 Contact with and (suspected) exposure to covid-19: Secondary | ICD-10-CM

## 2019-01-12 LAB — NOVEL CORONAVIRUS, NAA: SARS-CoV-2, NAA: NOT DETECTED

## 2019-01-13 ENCOUNTER — Encounter: Payer: Self-pay | Admitting: Emergency Medicine

## 2019-01-13 ENCOUNTER — Emergency Department: Payer: Medicaid Other

## 2019-01-13 ENCOUNTER — Other Ambulatory Visit: Payer: Self-pay

## 2019-01-13 ENCOUNTER — Emergency Department
Admission: EM | Admit: 2019-01-13 | Discharge: 2019-01-13 | Disposition: A | Discharge status: Home / Self Care | Payer: Medicaid Other | Attending: Emergency Medicine | Admitting: Emergency Medicine

## 2019-01-13 DIAGNOSIS — Z79899 Other long term (current) drug therapy: Secondary | ICD-10-CM | POA: Insufficient documentation

## 2019-01-13 DIAGNOSIS — R1031 Right lower quadrant pain: Secondary | ICD-10-CM | POA: Insufficient documentation

## 2019-01-13 LAB — COMPREHENSIVE METABOLIC PANEL
ALT: 31 U/L (ref 0–44)
AST: 23 U/L (ref 15–41)
Albumin: 4.3 g/dL (ref 3.5–5.0)
Alkaline Phosphatase: 123 U/L (ref 38–126)
Anion gap: 11 (ref 5–15)
BUN: 11 mg/dL (ref 6–20)
CO2: 24 mmol/L (ref 22–32)
Calcium: 9.3 mg/dL (ref 8.9–10.3)
Chloride: 103 mmol/L (ref 98–111)
Creatinine, Ser: 0.54 mg/dL (ref 0.44–1.00)
GFR calc Af Amer: >60 mL/min (ref >60)
GFR calc non Af Amer: >60 mL/min (ref >60)
Glucose, Bld: 96 mg/dL (ref 70–99)
Potassium: 3.5 mmol/L (ref 3.5–5.1)
Sodium: 138 mmol/L (ref 135–145)
Total Bilirubin: 0.7 mg/dL (ref 0.3–1.2)
Total Protein: 8 g/dL (ref 6.5–8.1)

## 2019-01-13 LAB — CBC
HCT: 39.8 % (ref 36.0–46.0)
Hemoglobin: 13.2 g/dL (ref 12.0–15.0)
MCH: 29.3 pg (ref 26.0–34.0)
MCHC: 33.2 g/dL (ref 30.0–36.0)
MCV: 88.2 fL (ref 80.0–100.0)
Platelets: 365 10*3/uL (ref 150–400)
RBC: 4.51 MIL/uL (ref 3.87–5.11)
RDW: 12.3 % (ref 11.5–15.5)
WBC: 7.2 10*3/uL (ref 4.0–10.5)
nRBC: 0 % (ref 0.0–0.2)

## 2019-01-13 LAB — URINALYSIS, COMPLETE (UACMP) WITH MICROSCOPIC
Bacteria, UA: NONE SEEN
Bilirubin Urine: NEGATIVE
Glucose, UA: NEGATIVE mg/dL
Ketones, ur: NEGATIVE mg/dL
Nitrite: NEGATIVE
Protein, ur: NEGATIVE mg/dL
Specific Gravity, Urine: 1.012 (ref 1.005–1.030)
pH: 6 (ref 5.0–8.0)

## 2019-01-13 LAB — PREGNANCY, URINE: Preg Test, Ur: NEGATIVE

## 2019-01-13 LAB — LIPASE, BLOOD: Lipase: 31 U/L (ref 11–51)

## 2019-01-13 MED ORDER — KETOROLAC TROMETHAMINE 30 MG/ML IJ SOLN
30.0000 mg | Freq: Once | INTRAMUSCULAR | Status: AC
  Administered 2019-01-13: 30 mg via INTRAMUSCULAR
  Filled 2019-01-13: qty 1

## 2019-01-13 MED ORDER — SODIUM CHLORIDE 0.9% FLUSH: 3.0000 mL | Freq: Once | INTRAVENOUS | Status: DC

## 2019-01-13 NOTE — ED Triage Notes (Addendum)
Pt sent to ED via POV from [place] for RLQ abdominal pain. Pt referred for possible appendicitis at this time. Pt states RLQ that started yesterday, pt c/o increased fatigue however feels better since yesterday. Pt states several weeks ago had exposure to positive Covid patient, pt states has had 2 negative Covid tests since exposure. Pt states RLQ abdominal pain and nausea at this time.

## 2019-01-13 NOTE — ED Notes (Signed)
Patient given warm blanket, advised not to drink until cleared by CT scan.

## 2019-01-13 NOTE — ED Notes (Signed)
Patient tolerated saltines and diet Sprite well. Dr. Burlene Arnt aware.

## 2019-01-13 NOTE — ED Provider Notes (Signed)
-----------------------------------------   6:18 PM on 01/13/2019 -----------------------------------------  Signed out to me, patient with CT scan pending if negative patient is to be discharged, CT scan shows no definitive evidence of appendicitis she has no white count, she is not focally tender at this time she has no complaint of pain at this time, I did discuss with Dr. Celine Ahr, she also feels the patient can be discharged.  Return precautions and follow-up given and understood we will ensure that she can tolerate p.o. prior to discharge.   Schuyler Amor, MD 01/13/19 [phone]

## 2019-01-13 NOTE — ED Provider Notes (Signed)
[facility] Emergency Department Provider Note   ____________________________________________    I have reviewed the triage vital signs and the nursing notes.   HISTORY  Chief Complaint Abdominal Pain     HPI Kayla Ball is a 53 y.o. female who presents with complaints of abdominal pain.  Patient reports approximately 1 to 2 days of primarily right lower quadrant abdominal pain.  Sent here by urgent care for concern regarding appendicitis.  Patient denies nausea vomiting.  No fevers or chills.  No back pain or dysuria.  Has not taken anything for this, she reports the pain is moderate.  No vaginal discharge or pelvic pain.  Past Medical History:  Diagnosis Date  . Anxiety   . IDA (iron deficiency anemia)   . Panic attacks     There are no active problems to display for this patient.   History reviewed. No pertinent surgical history.  Prior to Admission medications   Medication Sig Start Date End Date Taking? Authorizing Provider  benzonatate (TESSALON) 200 MG capsule Take 1 capsule (200 mg total) by mouth 3 (three) times daily as needed for cough. 01/09/19   Melynda Ripple, MD  citalopram (CELEXA) 20 MG tablet Take 20 mg by mouth daily.    [provider]  clonazePAM (KLONOPIN) 0.5 MG tablet Take 0.5 mg by mouth 2 (two) times daily as needed for anxiety.    [provider]  cyclobenzaprine (FLEXERIL) 10 MG tablet Take 1 tablet (10 mg total) by mouth 3 (three) times daily as needed. 07/05/17   Sable Feil, PA-C  fluticasone (FLONASE) 50 MCG/ACT nasal spray Place 2 sprays into both nostrils daily. Patient not taking: Reported on 04/19/2017 12/29/16   Menshew, Dannielle Karvonen, PA-C  hydrOXYzine (VISTARIL) 50 MG capsule Take 1 capsule (50 mg total) by mouth 3 (three) times daily as needed for anxiety. 04/20/17   Darel Hong, MD  Spacer/Aero-Holding Chambers (AEROCHAMBER PLUS) inhaler Use as instructed 01/09/19    Melynda Ripple, MD     Allergies Patient has no known allergies.  Family History  Family history unknown: Yes    Social History Social History   Tobacco Use  . Smoking status: Never Smoker  . Smokeless tobacco: Never Used  Substance Use Topics  . Alcohol use: Yes    Comment: occ  . Drug use: No    Review of Systems  Constitutional: No fever/chills Eyes: No visual changes.  ENT: No sore throat. Cardiovascular: Denies chest pain. Respiratory: Denies shortness of breath. Gastrointestinal: As above Genitourinary: Negative for dysuria. Musculoskeletal: Negative for back pain. Skin: Negative for rash. Neurological: Negative for headaches   ____________________________________________   PHYSICAL EXAM:  VITAL SIGNS: ED Triage Vitals  Enc Vitals Group     BP 01/13/19 1318 120/82     Pulse Rate 01/13/19 1318 78     Resp 01/13/19 1318 18     Temp 01/13/19 1318 98.2 F (36.8 C)     Temp Source 01/13/19 1318 Oral     SpO2 01/13/19 1318 97 %     Weight 01/13/19 1311 72.6 kg (160 lb)     Height --      Head Circumference --      Peak Flow --      Pain Score 01/13/19 1317 3     Pain Loc --      Pain Edu? --      Excl. in [place]? --     Constitutional: Alert and  oriented. No acute distress. Pleasant and interactive   Mouth/Throat: Mucous membranes are moist.    Cardiovascular: Normal rate, regular rhythm. Grossly normal heart sounds.  Good peripheral circulation. Respiratory: Normal respiratory effort.  No retractions. Lungs CTAB. Gastrointestinal: Mild tenderness in the right lower quadrant, otherwise soft no distention, no CVA tenderness Genitourinary: deferred Musculoskeletal:   Warm and well perfused Neurologic:  Normal speech and language. No gross focal neurologic deficits are appreciated.  Skin:  Skin is warm, dry and intact. No rash noted. Psychiatric: Mood and affect are normal. Speech and behavior are normal.   ____________________________________________   LABS (all labs ordered are listed, but only abnormal results are displayed)  Labs Reviewed  URINALYSIS, COMPLETE (UACMP) WITH MICROSCOPIC - Abnormal; Notable for the following components:      Result Value   Color, Urine YELLOW (*)    APPearance CLEAR (*)    Hgb urine dipstick SMALL (*)    Leukocytes,Ua TRACE (*)    All other components within normal limits  LIPASE, BLOOD  COMPREHENSIVE METABOLIC PANEL  CBC  PREGNANCY, URINE   ____________________________________________  EKG  None ____________________________________________  RADIOLOGY  CT abdomen pelvis ____________________________________________   PROCEDURES  Procedure(s) performed: No  Procedures   Critical Care performed: No ____________________________________________   INITIAL IMPRESSION / ASSESSMENT AND PLAN / ED COURSE  Pertinent labs & imaging results that were available during my care of the patient were reviewed by me and considered in my medical decision making (see chart for details).  Patient well-appearing and in no acute distress, lab work is quite reassuring, urinalysis demonstrates trace leukocytes but no bacteria or white blood cells noted.  White blood cell count is 7.2.  Given mild tenderness in the right lower quadrant will send for CT scan to evaluate for possible kidney stone versus appendicitis.  I have asked my colleague to follow-up on CT scan    ____________________________________________   FINAL CLINICAL IMPRESSION(S) / ED DIAGNOSES  Final diagnoses:  Right lower quadrant abdominal pain        Note:  This document was prepared using Dragon voice recognition software and may include unintentional dictation errors.   Jene EveryKinner, Jullien Granquist, MD 01/13/19 [phone]

## 2019-01-13 NOTE — Discharge Instructions (Addendum)
If you have increased pain, fever, vomiting or you feel worse return to the emergency room.  Otherwise, follow closely with primary care doctor.

## 2019-01-17 ENCOUNTER — Ambulatory Visit: Payer: Medicaid Other | Admitting: Dietician

## 2019-10-24 ENCOUNTER — Ambulatory Visit: Payer: Medicaid Other | Attending: Internal Medicine

## 2019-10-24 DIAGNOSIS — Z23 Encounter for immunization: Secondary | ICD-10-CM

## 2019-10-24 NOTE — Progress Notes (Signed)
   Covid-19 Vaccination Clinic  Name:  Kayla Ball    MRN: [id number] DOB: 11-24-1965  10/24/2019  Ms. Kayla Ball was observed post Covid-19 immunization for 15 minutes without incident. She was provided with Vaccine Information Sheet and instruction to access the V-Safe system.   Ms. Kayla Ball was instructed to call 911 with any severe reactions post vaccine: Marland Kitchen Difficulty breathing  . Swelling of face and throat  . A fast heartbeat  . A bad rash all over body  . Dizziness and weakness   Immunizations Administered    Name Date Dose VIS Date Route   Pfizer COVID-19 Vaccine 10/24/2019 11:15 AM 0.3 mL 07/01/2019 Intramuscular   Manufacturer: [employer], [employer]   Lot: [id number]   NDC: [address]-1

## 2019-11-14 ENCOUNTER — Ambulatory Visit: Payer: Medicaid Other | Attending: Internal Medicine

## 2019-11-14 DIAGNOSIS — Z23 Encounter for immunization: Secondary | ICD-10-CM

## 2019-11-14 NOTE — Progress Notes (Signed)
   Covid-19 Vaccination Clinic  Name:  Kayla Ball    MRN: [id number] DOB: Jan 29, 1966  11/14/2019  Ms. Kayla Ball was observed post Covid-19 immunization for 15 minutes without incident. She was provided with Vaccine Information Sheet and instruction to access the V-Safe system.   Ms. Kayla Ball was instructed to call 911 with any severe reactions post vaccine: Marland Kitchen Difficulty breathing  . Swelling of face and throat  . A fast heartbeat  . A bad rash all over body  . Dizziness and weakness   Immunizations Administered    Name Date Dose VIS Date Route   Pfizer COVID-19 Vaccine 11/14/2019 11:07 AM 0.3 mL 09/14/2018 Intramuscular   Manufacturer: [employer], [employer]   Lot: [id number]   NDC: [address]-3

## 2020-08-23 ENCOUNTER — Other Ambulatory Visit: Payer: Medicaid Other | Attending: Gastroenterology

## 2020-08-27 ENCOUNTER — Encounter: Admission: RE | Payer: Self-pay | Source: Home / Self Care

## 2020-08-27 ENCOUNTER — Ambulatory Visit: Admission: RE | Admit: 2020-08-27 | Payer: Medicaid Other | Source: Home / Self Care

## 2020-08-27 SURGERY — EGD (ESOPHAGOGASTRODUODENOSCOPY)
Anesthesia: General

## 2020-11-09 ENCOUNTER — Ambulatory Visit (INDEPENDENT_AMBULATORY_CARE_PROVIDER_SITE_OTHER): Payer: Medicaid Other

## 2020-11-09 ENCOUNTER — Encounter: Payer: Self-pay | Admitting: Emergency Medicine

## 2020-11-09 ENCOUNTER — Other Ambulatory Visit: Payer: Self-pay

## 2020-11-09 ENCOUNTER — Ambulatory Visit
Admission: EM | Admit: 2020-11-09 | Discharge: 2020-11-09 | Disposition: A | Discharge status: Home / Self Care | Payer: Medicaid Other | Attending: Emergency Medicine | Admitting: Emergency Medicine

## 2020-11-09 DIAGNOSIS — M546 Pain in thoracic spine: Secondary | ICD-10-CM

## 2020-11-09 DIAGNOSIS — R059 Cough, unspecified: Secondary | ICD-10-CM | POA: Diagnosis not present

## 2020-11-09 DIAGNOSIS — J101 Influenza due to other identified influenza virus with other respiratory manifestations: Secondary | ICD-10-CM | POA: Insufficient documentation

## 2020-11-09 DIAGNOSIS — J111 Influenza due to unidentified influenza virus with other respiratory manifestations: Secondary | ICD-10-CM

## 2020-11-09 LAB — RAPID INFLUENZA A&B ANTIGENS
Influenza A (ARMC): POSITIVE — AB
Influenza B (ARMC): NEGATIVE

## 2020-11-09 MED ORDER — IBUPROFEN 800 MG PO TABS: 800.0000 mg | ORAL_TABLET | Freq: Once | ORAL | Status: DC

## 2020-11-09 MED ORDER — IBUPROFEN 800 MG PO TABS: 800.0000 mg | ORAL_TABLET | Freq: Three times a day (TID) | ORAL | 0 refills | Status: DC | PRN

## 2020-11-09 MED ORDER — OSELTAMIVIR PHOSPHATE 75 MG PO CAPS: 75.0000 mg | ORAL_CAPSULE | Freq: Two times a day (BID) | ORAL | 0 refills | Status: AC

## 2020-11-09 NOTE — ED Provider Notes (Signed)
[facility]    CSN: [id number] Arrival date & time: 11/09/20  1401      History   Chief Complaint Chief Complaint  Patient presents with  . Cough  . Nasal Congestion  . Generalized Body Aches    HPI Kayla Ball is a 55 y.o. female.   Kayla Ball presents with complaints of cough, body aches, upper back pain and chills. Originally started three days ago with dry throat. Yesterday started feeling worse and today feels worst yet. No specific known fevers. No shortness of breath . Nausea no vomiting or diarrhea. Negative covid PCR testing. No known ill contacts.    ROS per HPI, negative if not otherwise mentioned.      Past Medical History:  Diagnosis Date  . Anxiety   . IDA (iron deficiency anemia)   . Panic attacks     There are no problems to display for this patient.   History reviewed. No pertinent surgical history.  OB History   No obstetric history on file.      Home Medications    Prior to Admission medications   Medication Sig Start Date End Date Taking? Authorizing Provider  citalopram (CELEXA) 20 MG tablet Take 20 mg by mouth daily.   Yes [provider]  fluticasone (FLONASE) 50 MCG/ACT nasal spray Place 2 sprays into both nostrils daily. 12/29/16  Yes Menshew, Charlesetta Ivory, PA-C  ibuprofen (ADVIL) 800 MG tablet Take 1 tablet (800 mg total) by mouth every 8 (eight) hours as needed for fever or headache. 11/09/20  Yes Georgetta Haber, NP  oseltamivir (TAMIFLU) 75 MG capsule Take 1 capsule (75 mg total) by mouth every 12 (twelve) hours for 5 days. 11/09/20 11/14/20 Yes Desira Alessandrini, Barron Alvine, NP  benzonatate (TESSALON) 200 MG capsule Take 1 capsule (200 mg total) by mouth 3 (three) times daily as needed for cough. 01/09/19   Domenick Gong, MD  clonazePAM (KLONOPIN) 0.5 MG tablet Take 0.5 mg by mouth 2 (two) times daily as needed for anxiety.    [provider]  cyclobenzaprine (FLEXERIL) 10 MG tablet Take 1  tablet (10 mg total) by mouth 3 (three) times daily as needed. 07/05/17   Joni Reining, PA-C  hydrOXYzine (VISTARIL) 50 MG capsule Take 1 capsule (50 mg total) by mouth 3 (three) times daily as needed for anxiety. 04/20/17   Merrily Brittle, MD  Spacer/Aero-Holding Chambers (AEROCHAMBER PLUS) inhaler Use as instructed 01/09/19   Domenick Gong, MD    Family History Family History  Family history unknown: Yes    Social History Social History   Tobacco Use  . Smoking status: Never Smoker  . Smokeless tobacco: Never Used  Vaping Use  . Vaping Use: Never used  Substance Use Topics  . Alcohol use: Yes    Comment: occ  . Drug use: No     Allergies   Patient has no known allergies.   Review of Systems Review of Systems   Physical Exam Triage Vital Signs ED Triage Vitals  Enc Vitals Group     BP 11/09/20 1424 (!) 150/105     Pulse Rate 11/09/20 1424 91     Resp 11/09/20 1424 14     Temp 11/09/20 1424 98.6 F (37 C)     Temp Source 11/09/20 1424 Oral     SpO2 11/09/20 1424 100 %     Weight 11/09/20 1420 175 lb (79.4 kg)     Height 11/09/20 1420 5'  4" (1.626 m)     Head Circumference --      Peak Flow --      Pain Score 11/09/20 1420 4     Pain Loc --      Pain Edu? --      Excl. in [place]? --    No data found.  Updated Vital Signs BP (!) 150/105 (BP Location: Left Arm)   Pulse 91   Temp 98.6 F (37 C) (Oral)   Resp 14   Ht 5\' 4"  (1.626 m)   Wt 175 lb (79.4 kg)   SpO2 100%   BMI 30.04 kg/m   Visual Acuity Right Eye Distance:   Left Eye Distance:   Bilateral Distance:    Right Eye Near:   Left Eye Near:    Bilateral Near:     Physical Exam Constitutional:      General: She is not in acute distress.    Appearance: She is well-developed.  Cardiovascular:     Rate and Rhythm: Normal rate and regular rhythm.  Pulmonary:     Effort: Pulmonary effort is normal.     Breath sounds: Normal breath sounds.  Musculoskeletal:     Thoracic back: No  tenderness or bony tenderness.  Skin:    General: Skin is warm and dry.  Neurological:     Mental Status: She is alert and oriented to person, place, and time.      UC Treatments / Results  Labs (all labs ordered are listed, but only abnormal results are displayed) Labs Reviewed  RAPID INFLUENZA A&B ANTIGENS - Abnormal; Notable for the following components:      Result Value   Influenza A ([facility]) POSITIVE (*)    All other components within normal limits    EKG   Radiology DG Chest 2 View  Result Date: 11/09/2020 CLINICAL DATA:  Upper back pain.  Cough.  Influenza. EXAM: CHEST - 2 VIEW COMPARISON:  01/09/2019 FINDINGS: Normal heart size.The cardiomediastinal contours are unchanged with aortic atherosclerosis and tortuosity. The lungs are clear. Pulmonary vasculature is normal. No consolidation, pleural effusion, or pneumothorax. No acute osseous abnormalities are seen. Mild degenerative spurring in the midthoracic spine. IMPRESSION: No acute chest findings.  No explanation for cough for back pain. Aortic Atherosclerosis (ICD10-I70.0). Electronically Signed   By: 01/11/2019 M.D.   On: 11/09/2020 15:31    Procedures Procedures (including critical care time)  Medications Ordered in UC Medications  ibuprofen (ADVIL) tablet 800 mg (has no administration in time range)    Initial Impression / Assessment and Plan / UC Course  I have reviewed the triage vital signs and the nursing notes.  Pertinent labs & imaging results that were available during my care of the patient were reviewed by me and considered in my medical decision making (see chart for details).     Non toxic. Benign physical exam.  Positive here for influenza a. Vitals stable. Patient very concerned about upper back pain and symptoms, requesting xray, which is normal today and reassuring. Supportive cares recommended. tamiflu provided as well. Return precautions provided. Patient verbalized understanding and  agreeable to plan.   Final Clinical Impressions(s) / UC Diagnoses   Final diagnoses:  Influenza A     Discharge Instructions     You are positive for influenza a, which is consistent with the symptoms you are exhibiting.  Push fluids to ensure adequate hydration and keep secretions thin.  Tylenol and/or ibuprofen as needed for pain or fevers.  Tamiflu is an antiviral you may start, with the goal of decreasing length of illness.  If symptoms worsen or do not improve in the next week to return to be seen or to follow up with your PCP.      ED Prescriptions    Medication Sig Dispense Auth. Provider   oseltamivir (TAMIFLU) 75 MG capsule Take 1 capsule (75 mg total) by mouth every 12 (twelve) hours for 5 days. 10 capsule Linus Mako B, NP   ibuprofen (ADVIL) 800 MG tablet Take 1 tablet (800 mg total) by mouth every 8 (eight) hours as needed for fever or headache. 30 tablet Georgetta Haber, NP     PDMP not reviewed this encounter.   Georgetta Haber, NP 11/09/20 1558

## 2020-11-09 NOTE — Discharge Instructions (Signed)
You are positive for influenza a, which is consistent with the symptoms you are exhibiting.  Push fluids to ensure adequate hydration and keep secretions thin.  Tylenol and/or ibuprofen as needed for pain or fevers.   Tamiflu is an antiviral you may start, with the goal of decreasing length of illness.  If symptoms worsen or do not improve in the next week to return to be seen or to follow up with your PCP.

## 2020-11-09 NOTE — ED Triage Notes (Signed)
Patient c/o cough, congestion, runny nose, and bodyaches that started on Thursday.  Patient had PCR covid test yesterday and this morning and was negative.  Patient denies fevers.

## 2021-02-27 ENCOUNTER — Other Ambulatory Visit: Payer: Self-pay

## 2021-02-27 ENCOUNTER — Encounter: Payer: Self-pay | Admitting: Dietician

## 2021-02-27 ENCOUNTER — Encounter: Payer: Medicaid Other | Attending: Family Medicine | Admitting: Dietician

## 2021-02-27 VITALS — Ht 64.0 in | Wt 179.8 lb

## 2021-02-27 DIAGNOSIS — Z6830 Body mass index (BMI) 30.0-30.9, adult: Secondary | ICD-10-CM

## 2021-02-27 DIAGNOSIS — E669 Obesity, unspecified: Secondary | ICD-10-CM | POA: Insufficient documentation

## 2021-02-27 DIAGNOSIS — Z6834 Body mass index (BMI) 34.0-34.9, adult: Secondary | ICD-10-CM | POA: Diagnosis not present

## 2021-02-27 NOTE — Progress Notes (Signed)
Medical Nutrition Therapy: Visit start time: 1100  end time: 1200  Assessment:  Diagnosis: obesity Past medical history: depression, elevated BG, GERD/ heartburn Psychosocial issues/ stress concerns: high stress level, depression  Preferred learning method:  Auditory Visual   Current weight: 179.8lbs Height: 5'4" BMI: 30.68 Medications, supplements: reconciled list in medical record  Progress and evaluation:  Patient reports goal weight of 140lbs; she reports history of gaining weight easily.  She reports generally eating healthy; does not eat much red meat, limits breads Was doing weill with regular exercise, but then stopped exercising and has not done exercise in past month.  Tried weight watchers, lost 5lb in 2 weeks but then quit.   Tried intermittent fasting also and lost some weight but stopped after a few weeks.  Reports more time indoors, less activity since covid started. Grieving loss of husband, dealing with family issues, job ended all within past few months. Currently lives with sister and father; daughter and son are also supportive.  Physical activity: no regular actvity  Dietary Intake:  Usual eating pattern includes 2 meals and 1-2 snacks per day. Dining out frequency: 2-3 meals per week.  Breakfast: Green/ black tea, crackers with jelly and/or cheese; eggs, bacon, fruit 1-2 times a week Snack: occ Greek yogurt with small amount granola 2 tsp Lunch: usually none Snack: none Supper: 5:00-6pm -- salad, pasta with homemade sauce with veg; rice / potato + chicken grilled or baked or air fried; boiled; usually cookie or other sweet after meal Snack: none; occ tea with spenda and small crackers Beverages: green or black tea with splenda; water; some coffee in am  Nutrition Care Education: Topics covered:  Basic nutrition: basic food groups, appropriate nutrient balance, appropriate meal and snack schedule, general nutrition guidelines    Weight control: importance  of low sugar and low fat choices; appropriate food portions; estimated energy needs for weight loss at 1200-1300kcal, provided guidance for 45% CHO, 25% protein, (30% fat); importance of regular physical activity; effects of stress on weight management Diabetes prevention:  appropriate meal and snack schedule; appropriate carb intake and balance, healthy carb choices; roles of protein, fiber Other: making manageable changes within current ability and environment, avoiding too-lofty goals  Nutritional Diagnosis:  [place]-3.3 Overweight/obesity As related to history of excess calories, inadequate physical activity, stress.  As evidenced by patient with current BMI of 30.86.  Intervention:  Instruction and discussion as noted above. Patient seems to be making generally healthy food choices. She voices motivation to continue with positive lifestyle changes. Established goals for change with direction from patient. Patient would like visit with [facility] counselor as part of MNT program; counselor will contact patient to schedule appointment.  Education Materials given:  Designer, industrial/product with food lists, sample meal pattern Sample menus Snacking handout Visit summary with goals/ instructions   Learner/ who was taught:  Patient    Level of understanding: Verbalizes/ demonstrates competency   Demonstrated degree of understanding via:   Teach back Learning barriers: None  Willingness to learn/ readiness for change: Eager, change in progress   Monitoring and Evaluation:  Dietary intake, exercise, and body weight      follow up:  04/10/21 at 2:30pm

## 2021-02-27 NOTE — Patient Instructions (Signed)
Start some regular exercise. Start with small amount of time and gradually increase as able.  Meet with counselor to help manage stress. Drink more water during the day, continue to carry water cup to promote more water drinking during the day.  Keep portions of starchy foods to the size of a fisted hand or less  Plan to have something to eat every 3-4 hours during the day; OK to stop eating in the evening, but best not to go longer than 12-14 hours overnight without anything to eat

## 2021-04-10 ENCOUNTER — Ambulatory Visit: Payer: Medicaid Other | Admitting: Dietician

## 2021-05-13 ENCOUNTER — Encounter: Payer: Self-pay | Admitting: Dietician

## 2021-05-13 NOTE — Progress Notes (Signed)
Have not heard back from patient to reschedule her missed appointment from 04/10/21. Sent notification to referring provider.

## 2021-09-16 ENCOUNTER — Other Ambulatory Visit: Payer: Self-pay | Admitting: Obstetrics and Gynecology

## 2021-09-16 ENCOUNTER — Other Ambulatory Visit: Payer: Self-pay

## 2021-09-16 DIAGNOSIS — Z1231 Encounter for screening mammogram for malignant neoplasm of breast: Secondary | ICD-10-CM

## 2021-09-16 DIAGNOSIS — Z1211 Encounter for screening for malignant neoplasm of colon: Secondary | ICD-10-CM

## 2021-09-16 MED ORDER — PEG 3350-KCL-NA BICARB-NACL 420 G PO SOLR: 4000.0000 mL | Freq: Once | ORAL | 0 refills | Status: AC

## 2021-09-16 NOTE — Progress Notes (Signed)
Gastroenterology Pre-Procedure Review  Request Date: 10/07/2021 Requesting Physician: Dr. Allen Norris  PATIENT REVIEW QUESTIONS: The patient responded to the following health history questions as indicated:    1. Are you having any GI issues? no 2. Do you have a personal history of Polyps? no 3. Do you have a family history of Colon Cancer or Polyps? yes (son polyps) 4. Diabetes Mellitus? no 5. Joint replacements in the past 12 months?no 6. Major health problems in the past 3 months?no 7. Any artificial heart valves, MVP, or defibrillator?no    MEDICATIONS & ALLERGIES:    Patient reports the following regarding taking any anticoagulation/antiplatelet therapy:   Plavix, Coumadin, Eliquis, Xarelto, Lovenox, Pradaxa, Brilinta, or Effient? no Aspirin? no  Patient confirms/reports the following medications:  Current Outpatient Medications  Medication Sig Dispense Refill   benzonatate (TESSALON) 200 MG capsule Take 1 capsule (200 mg total) by mouth 3 (three) times daily as needed for cough. 30 capsule 0   citalopram (CELEXA) 40 MG tablet Take by mouth.     clonazePAM (KLONOPIN) 0.5 MG tablet Take 0.5 mg by mouth 2 (two) times daily as needed for anxiety.     cyclobenzaprine (FLEXERIL) 10 MG tablet Take 1 tablet (10 mg total) by mouth 3 (three) times daily as needed. 15 tablet 0   fluticasone (FLONASE) 50 MCG/ACT nasal spray Place 2 sprays into both nostrils daily. 16 g 0   hydrOXYzine (VISTARIL) 50 MG capsule Take 1 capsule (50 mg total) by mouth 3 (three) times daily as needed for anxiety. 30 capsule 0   ibuprofen (ADVIL) 800 MG tablet Take 1 tablet (800 mg total) by mouth every 8 (eight) hours as needed for fever or headache. 30 tablet 0   pantoprazole (PROTONIX) 40 MG tablet Take by mouth.     Spacer/Aero-Holding Chambers (AEROCHAMBER PLUS) inhaler Use as instructed 1 each 2   No current facility-administered medications for this visit.    Patient confirms/reports the following allergies:   No Known Allergies  No orders of the defined types were placed in this encounter.   AUTHORIZATION INFORMATION Primary Insurance: 1D#: Group #:  Secondary Insurance: 1D#: Group #:  SCHEDULE INFORMATION: Date: 10/07/2021 Time: Location:[facility]

## 2021-09-30 ENCOUNTER — Encounter: Payer: Self-pay | Admitting: Gastroenterology

## 2021-10-04 MED ORDER — PROPOFOL 500 MG/50ML IV EMUL
INTRAVENOUS | Status: AC
  Filled 2021-10-04: qty 50

## 2021-10-04 MED ORDER — EPHEDRINE 5 MG/ML INJ
INTRAVENOUS | Status: AC
  Filled 2021-10-04: qty 5

## 2021-10-04 MED ORDER — LIDOCAINE HCL (PF) 2 % IJ SOLN
INTRAMUSCULAR | Status: AC
  Filled 2021-10-04: qty 10

## 2021-10-07 ENCOUNTER — Encounter: Payer: Self-pay | Admitting: Gastroenterology

## 2021-10-07 ENCOUNTER — Encounter
Admission: RE | Disposition: A | Discharge status: Home / Self Care | Payer: Self-pay | Source: Home / Self Care | Attending: Gastroenterology

## 2021-10-07 ENCOUNTER — Ambulatory Visit
Admission: RE | Admit: 2021-10-07 | Discharge: 2021-10-07 | Disposition: A | Discharge status: Home / Self Care | Payer: Medicaid Other | Attending: Gastroenterology | Admitting: Gastroenterology

## 2021-10-07 ENCOUNTER — Ambulatory Visit: Payer: Medicaid Other | Admitting: Anesthesiology

## 2021-10-07 ENCOUNTER — Other Ambulatory Visit: Payer: Self-pay

## 2021-10-07 DIAGNOSIS — Z79899 Other long term (current) drug therapy: Secondary | ICD-10-CM | POA: Diagnosis not present

## 2021-10-07 DIAGNOSIS — F419 Anxiety disorder, unspecified: Secondary | ICD-10-CM | POA: Insufficient documentation

## 2021-10-07 DIAGNOSIS — Z1211 Encounter for screening for malignant neoplasm of colon: Secondary | ICD-10-CM | POA: Insufficient documentation

## 2021-10-07 HISTORY — PX: COLONOSCOPY WITH PROPOFOL: SHX5780

## 2021-10-07 LAB — POCT PREGNANCY, URINE: Preg Test, Ur: NEGATIVE

## 2021-10-07 SURGERY — COLONOSCOPY WITH PROPOFOL
Anesthesia: General | Site: Rectum

## 2021-10-07 MED ORDER — LACTATED RINGERS IV SOLN: INTRAVENOUS | Status: DC

## 2021-10-07 MED ORDER — LIDOCAINE HCL (CARDIAC) PF 100 MG/5ML IV SOSY
PREFILLED_SYRINGE | INTRAVENOUS | Status: DC | PRN
  Administered 2021-10-07: 30 mg via INTRAVENOUS

## 2021-10-07 MED ORDER — STERILE WATER FOR IRRIGATION IR SOLN
Status: DC | PRN
  Administered 2021-10-07: 100 mL

## 2021-10-07 MED ORDER — PROPOFOL 10 MG/ML IV BOLUS
INTRAVENOUS | Status: DC | PRN
  Administered 2021-10-07: 50 mg via INTRAVENOUS
  Administered 2021-10-07: 30 mg via INTRAVENOUS
  Administered 2021-10-07 (×2): 50 mg via INTRAVENOUS
  Administered 2021-10-07: 150 mg via INTRAVENOUS
  Administered 2021-10-07: 50 mg via INTRAVENOUS

## 2021-10-07 MED ORDER — SODIUM CHLORIDE 0.9 % IV SOLN: INTRAVENOUS | Status: DC

## 2021-10-07 SURGICAL SUPPLY — 6 items
GOWN CVR UNV OPN BCK APRN NK (MISCELLANEOUS) ×2 IMPLANT
GOWN ISOL THUMB LOOP REG UNIV (MISCELLANEOUS) ×4
KIT PRC NS LF DISP ENDO (KITS) ×1 IMPLANT
KIT PROCEDURE OLYMPUS (KITS) ×2
MANIFOLD NEPTUNE II (INSTRUMENTS) ×2 IMPLANT
WATER STERILE IRR 250ML POUR (IV SOLUTION) ×2 IMPLANT

## 2021-10-07 NOTE — Anesthesia Postprocedure Evaluation (Signed)
Anesthesia Post Note ? ?Patient: Kayla Ball ? ?Procedure(s) Performed: COLONOSCOPY WITH PROPOFOL (Rectum) ? ? ?  ?Patient location during evaluation: PACU ?Anesthesia Type: General ?Level of consciousness: awake and alert ?Pain management: pain level controlled ?Vital Signs Assessment: post-procedure vital signs reviewed and stable ?Respiratory status: spontaneous breathing, nonlabored ventilation, respiratory function stable and patient connected to nasal cannula oxygen ?Cardiovascular status: blood pressure returned to baseline and stable ?Postop Assessment: no apparent nausea or vomiting ?Anesthetic complications: no ? ? ?No notable events documented. ? ?[name] ? ? ? ? ? ?

## 2021-10-07 NOTE — H&P (Signed)
? ?  Kayla Minium, MD [facility] ?[phone] ?[place], [place] [address] ?Phone: [phone] ?Fax : [phone] ? ?Primary Care Physician:  [facility] ?Primary Gastroenterologist:  Dr. Servando Snare ? ?Pre-Procedure History & Physical: ?HPI:  Kayla Ball is a 56 y.o. female is here for a screening colonoscopy.  ? ?Past Medical History:  ?Diagnosis Date  ? Anxiety   ? IDA (iron deficiency anemia)   ? Panic attacks   ? ? ?History reviewed. No pertinent surgical history. ? ?Prior to Admission medications   ?Medication Sig Start Date End Date Taking? Authorizing Provider  ?citalopram (CELEXA) 40 MG tablet Take by mouth. 06/27/20 09/30/21 Yes [provider]  ?pantoprazole (PROTONIX) 40 MG tablet Take by mouth. 11/15/20 11/15/21 Yes [provider]  ?Spacer/Aero-Holding Chambers (AEROCHAMBER PLUS) inhaler Use as instructed 01/09/19  Yes Domenick Gong, MD  ? ? ?Allergies as of 09/16/2021  ? (No Known Allergies)  ? ? ?Family History  ?Family history unknown: Yes  ? ? ?Social History  ? ?Socioeconomic History  ? Marital status: Divorced  ?  Spouse name: Not on file  ? Number of children: Not on file  ? Years of education: Not on file  ? Highest education level: Not on file  ?Occupational History  ? Not on file  ?Tobacco Use  ? Smoking status: Never  ? Smokeless tobacco: Never  ?Vaping Use  ? Vaping Use: Never used  ?Substance and Sexual Activity  ? Alcohol use: Yes  ?  Comment: occasional, social  ? Drug use: No  ? Sexual activity: Not on file  ?Other Topics Concern  ? Not on file  ?Social History Narrative  ? ** Merged History Encounter **  ?    ? ?Social Determinants of Health  ? ?Financial Resource Strain: Not on file  ?Food Insecurity: Not on file  ?Transportation Needs: Not on file  ?Physical Activity: Not on file  ?Stress: Not on file  ?Social Connections: Not on file  ?Intimate Partner Violence: Not on file  ? ? ?Review of Systems: ?See HPI, otherwise negative ROS ? ?Physical  Exam: ?BP 135/74   Pulse 72   Temp 98.1 ?F (36.7 ?C) (Temporal)   Resp 18   Ht 5\' 4"  (1.626 m)   Wt 82 kg   SpO2 99%   BMI 31.03 kg/m?  ?General:   Alert,  pleasant and cooperative in NAD ?Head:  Normocephalic and atraumatic. ?Neck:  Supple; no masses or thyromegaly. ?Lungs:  Clear throughout to auscultation.    ?Heart:  Regular rate and rhythm. ?Abdomen:  Soft, nontender and nondistended. Normal bowel sounds, without guarding, and without rebound.   ?Neurologic:  Alert and  oriented x4;  grossly normal neurologically. ? ?Impression/Plan: ?Kayla Ball is now here to undergo a screening colonoscopy. ? ?Risks, benefits, and alternatives regarding colonoscopy have been reviewed with the patient.  Questions have been answered.  All parties agreeable. ?

## 2021-10-07 NOTE — Anesthesia Preprocedure Evaluation (Signed)
Anesthesia Evaluation  ?Patient identified by MRN, date of birth, ID band ?Patient awake ? ? ? ?History of Anesthesia Complications ?Negative for: history of anesthetic complications ? ?Airway ?Mallampati: II ? ?TM Distance: >3 FB ?Neck ROM: Full ? ? ? Dental ?no notable dental hx. ? ?  ?Pulmonary ?neg pulmonary ROS,  ?  ?Pulmonary exam normal ? ? ? ? ? ? ? Cardiovascular ?Exercise Tolerance: Good ?negative cardio ROS ?Normal cardiovascular exam ? ? ?  ?Neuro/Psych ?negative neurological ROS ?   ? GI/Hepatic ?Neg liver ROS, GERD  Medicated and Controlled,  ?Endo/Other  ?BMI 31 ? Renal/GU ?negative Renal ROS  ? ?  ?Musculoskeletal ? ? Abdominal ?  ?Peds ? Hematology ?negative hematology ROS ?(+)   ?Anesthesia Other Findings ? ? Reproductive/Obstetrics ? ?  ? ? ? ? ? ? ? ? ? ? ? ? ? ?  ?  ? ? ? ? ? ? ? ? ?Anesthesia Physical ?Anesthesia Plan ? ?ASA: 2 ? ?Anesthesia Plan: General  ? ?Post-op Pain Management: Minimal or no pain anticipated  ? ?Induction:  ? ?PONV Risk Score and Plan: 2 and Propofol infusion, TIVA and Treatment may vary due to age or medical condition ? ?Airway Management Planned: Nasal Cannula and Natural Airway ? ?Additional Equipment: None ? ?Intra-op Plan:  ? ?Post-operative Plan:  ? ?Informed Consent: I have reviewed the patients History and Physical, chart, labs and discussed the procedure including the risks, benefits and alternatives for the proposed anesthesia with the patient or authorized representative who has indicated his/her understanding and acceptance.  ? ? ? ? ? ?Plan Discussed with: CRNA ? ?Anesthesia Plan Comments:   ? ? ? ? ? ? ?Anesthesia Quick Evaluation ? ?

## 2021-10-07 NOTE — Transfer of Care (Signed)
Immediate Anesthesia Transfer of Care Note ? ?Patient: Kayla Ball ? ?Procedure(s) Performed: COLONOSCOPY WITH PROPOFOL (Rectum) ? ?Patient Location: PACU ? ?Anesthesia Type: General ? ?Level of Consciousness: awake, alert  and patient cooperative ? ?Airway and Oxygen Therapy: Patient Spontanous Breathing and Patient connected to supplemental oxygen ? ?Post-op Assessment: Post-op Vital signs reviewed, Patient's Cardiovascular Status Stable, Respiratory Function Stable, Patent Airway and No signs of Nausea or vomiting ? ?Post-op Vital Signs: Reviewed and stable ? ?Complications: No notable events documented. ? ?

## 2021-10-07 NOTE — Op Note (Signed)
[facility] ?Gastroenterology ?Patient Name: Kayla Ball ?Procedure Date: 10/07/2021 9:03 AM ?MRN: [id number] ?Account #: [id number] ?Date of Birth: Sep 05, 1965 ?Admit Type: Outpatient ?Age: 56 ?Room: [facility] OR ROOM 01 ?Gender: Female ?Note Status: Finalized ?Instrument Name: [id number] ?Procedure:             Colonoscopy ?Indications:           Screening for colorectal malignant neoplasm ?Providers:             Lucilla Lame MD, MD ?Referring MD:          [facility] [place] (Referring MD) ?Medicines:             Propofol per Anesthesia ?Complications:         No immediate complications. ?Procedure:             Pre-Anesthesia Assessment: ?                       - Prior to the procedure, a History and Physical was  ?                       performed, and patient medications and allergies were  ?                       reviewed. The patient's tolerance of previous  ?                       anesthesia was also reviewed. The risks and benefits  ?                       of the procedure and the sedation options and risks  ?                       were discussed with the patient. All questions were  ?                       answered, and informed consent was obtained. Prior  ?                       Anticoagulants: The patient has taken no previous  ?                       anticoagulant or antiplatelet agents. ASA Grade  ?                       Assessment: II - A patient with mild systemic disease.  ?                       After reviewing the risks and benefits, the patient  ?                       was deemed in satisfactory condition to undergo the  ?                       procedure. ?                       After obtaining informed consent, the colonoscope was  ?  passed under direct vision. Throughout the procedure,  ?                       the patient's blood pressure, pulse, and oxygen  ?                       saturations were monitored continuously. The  ?                        Colonoscope was introduced through the anus and  ?                       advanced to the the cecum, identified by appendiceal  ?                       orifice and ileocecal valve. The colonoscopy was  ?                       performed without difficulty. The patient tolerated  ?                       the procedure well. The quality of the bowel  ?                       preparation was excellent. ?Findings: ?     The perianal and digital rectal examinations were normal. ?     The colon (entire examined portion) appeared normal. ?Impression:            - The entire examined colon is normal. ?                       - No specimens collected. ?Recommendation:        - Discharge patient to home. ?                       - Resume previous diet. ?                       - Continue present medications. ?                       - Repeat colonoscopy in 10 years for screening  ?                       purposes. ?Procedure Code(s):     --- Professional --- ?                       [phone], Colonoscopy, flexible; diagnostic, including  ?                       collection of specimen(s) by brushing or washing, when  ?                       performed (separate procedure) ?Diagnosis Code(s):     --- Professional --- ?                       Z12.11, Encounter for screening for malignant neoplasm  ?  of colon ?CPT copyright 2019 [employer]. All rights reserved. ?The codes documented in this report are preliminary and upon coder review may  ?be revised to meet current compliance requirements. ?Lucilla Lame MD, MD ?10/07/2021 9:22:10 AM ?This report has been signed electronically. ?Number of Addenda: 0 ?Note Initiated On: 10/07/2021 9:03 AM ?Scope Withdrawal Time: 0 hours 6 minutes 18 seconds  ?Total Procedure Duration: 0 hours 10 minutes 21 seconds  ?Estimated Blood Loss:  Estimated blood loss: none. ?     [facility] ?

## 2021-10-08 ENCOUNTER — Encounter: Payer: Self-pay | Admitting: Gastroenterology

## 2021-10-10 ENCOUNTER — Other Ambulatory Visit: Payer: Self-pay

## 2021-10-10 ENCOUNTER — Ambulatory Visit
Admission: RE | Admit: 2021-10-10 | Discharge: 2021-10-10 | Disposition: A | Discharge status: Home / Self Care | Payer: Medicaid Other | Source: Ambulatory Visit | Attending: Obstetrics and Gynecology | Admitting: Obstetrics and Gynecology

## 2021-10-10 DIAGNOSIS — Z1231 Encounter for screening mammogram for malignant neoplasm of breast: Secondary | ICD-10-CM | POA: Insufficient documentation

## 2022-04-29 IMAGING — MG MM DIGITAL SCREENING BILAT W/ TOMO AND CAD
6 of 12 series · 6 of 36 positions shown · non-contrast
Comparison: Previous exam(s).

CLINICAL DATA: Screening.

EXAM:
DIGITAL SCREENING BILATERAL MAMMOGRAM WITH TOMOSYNTHESIS AND CAD
TECHNIQUE: Bilateral screening digital craniocaudal and mediolateral oblique
mammograms were obtained. Bilateral screening digital breast
tomosynthesis was performed. The images were evaluated with
computer-aided detection.

[L CC synth-2D (1 of 2)]
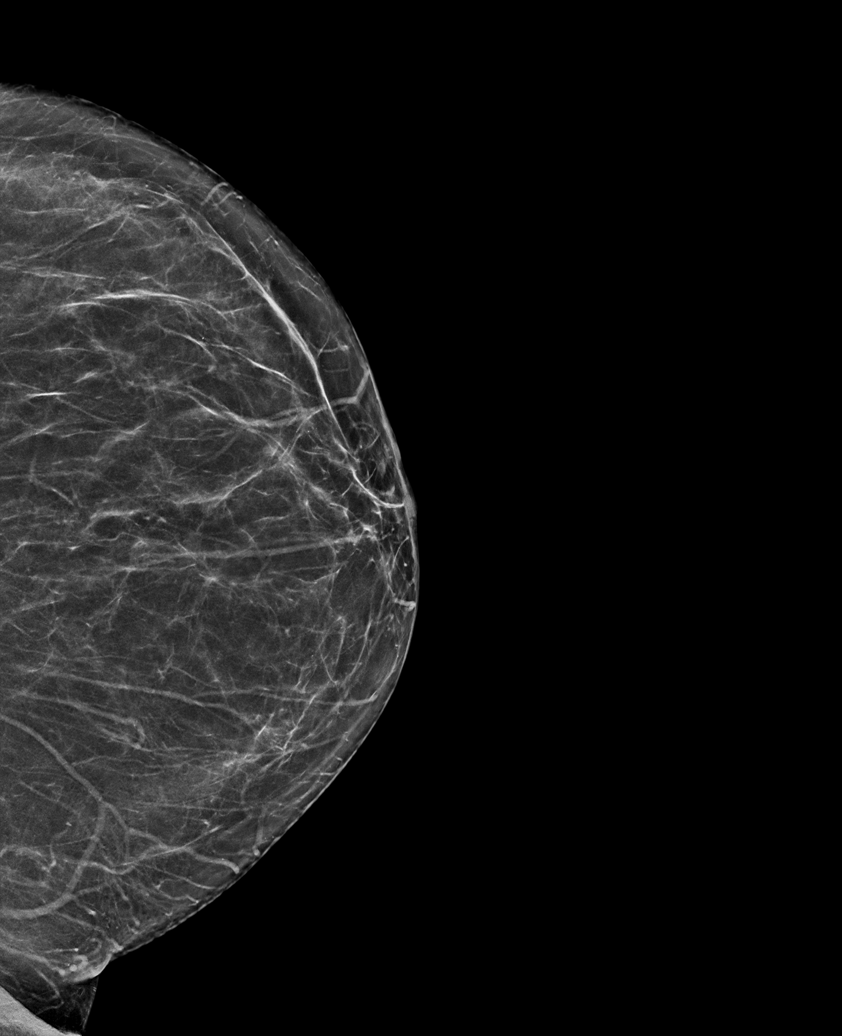

[L CC synth-2D (2 of 2)]
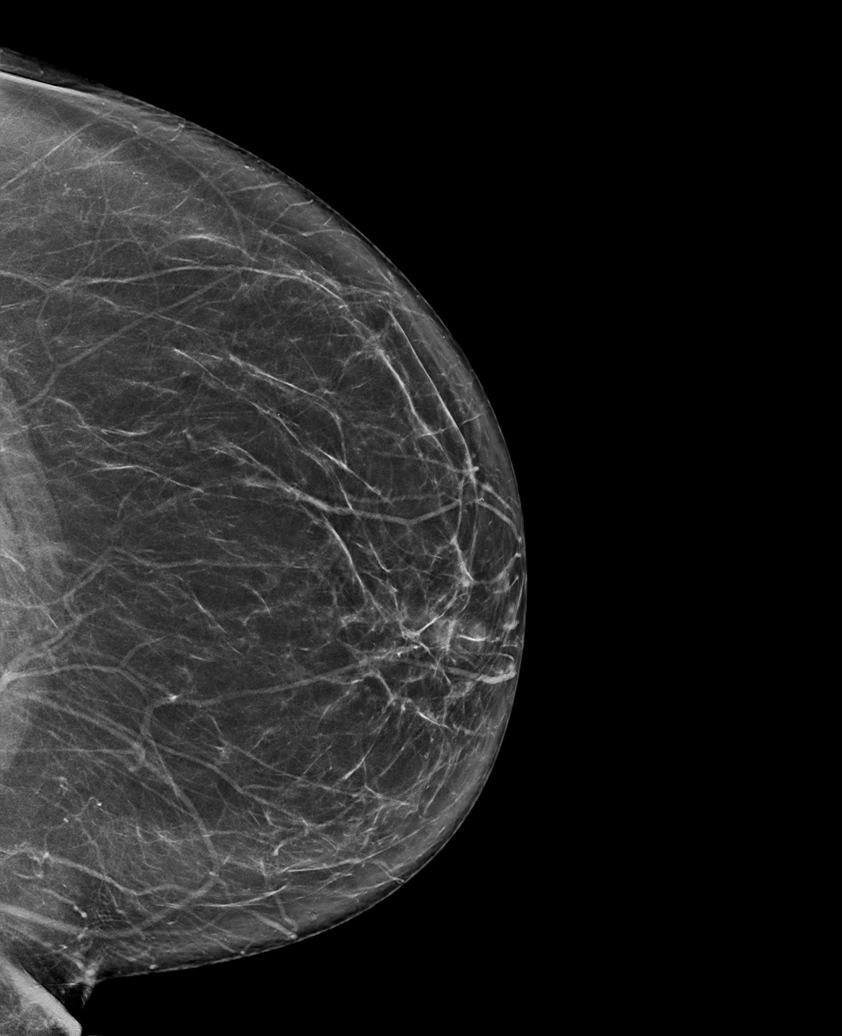

[R MLO synth-2D]
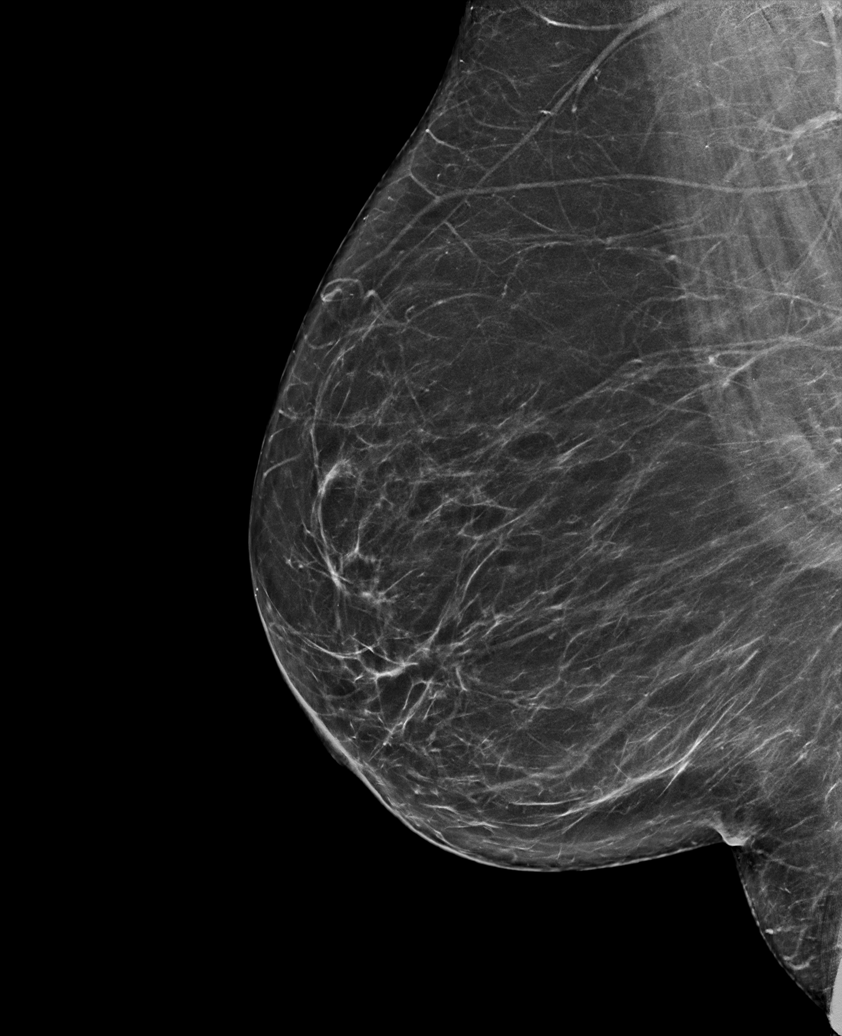

[R CC synth-2D (1 of 2)]
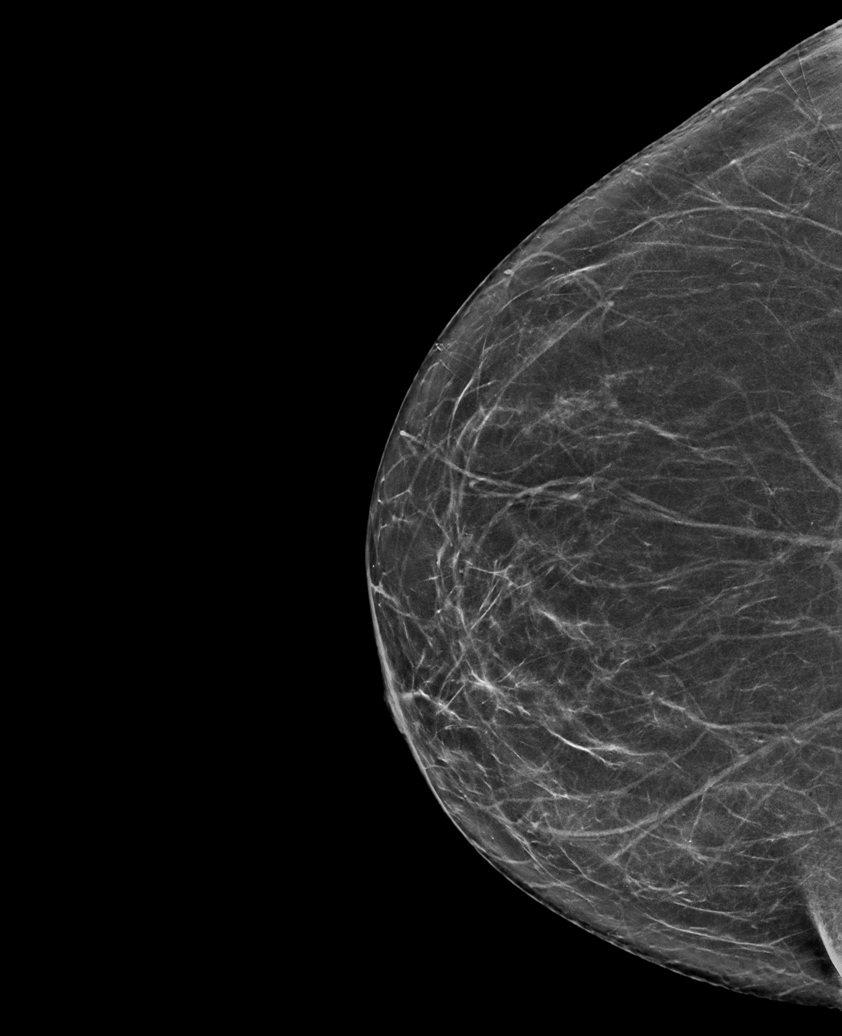

[R CC synth-2D (2 of 2)]
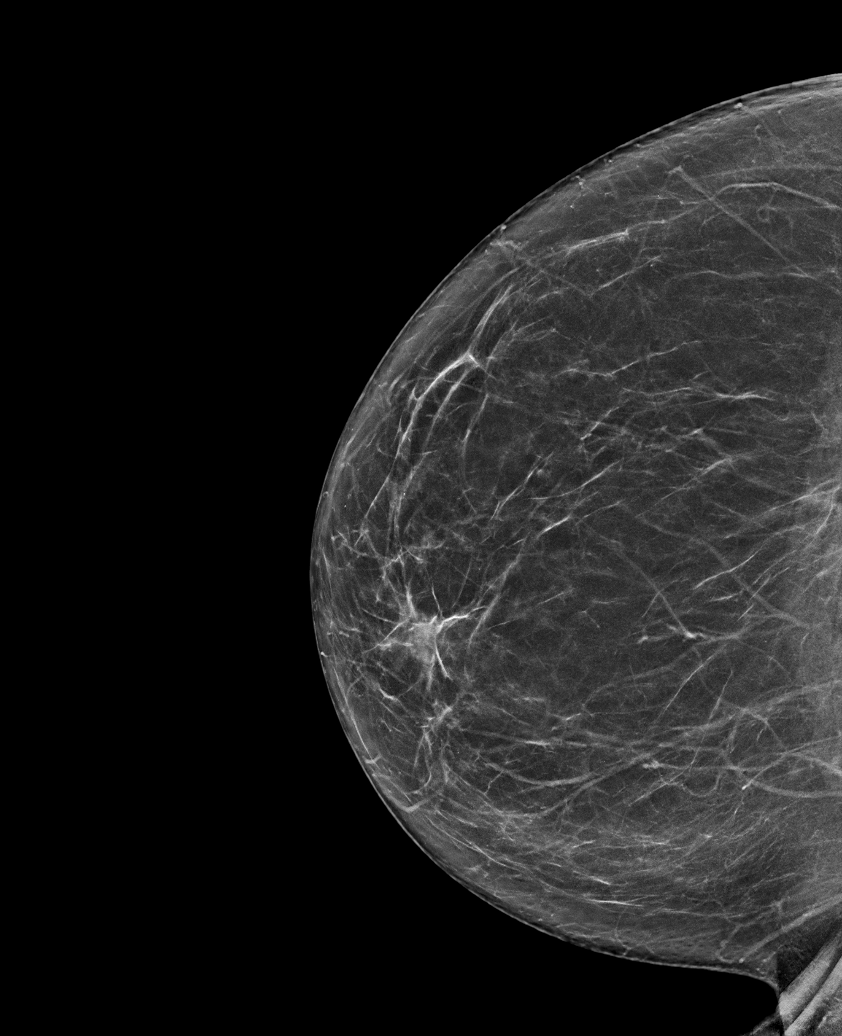

[L MLO synth-2D]
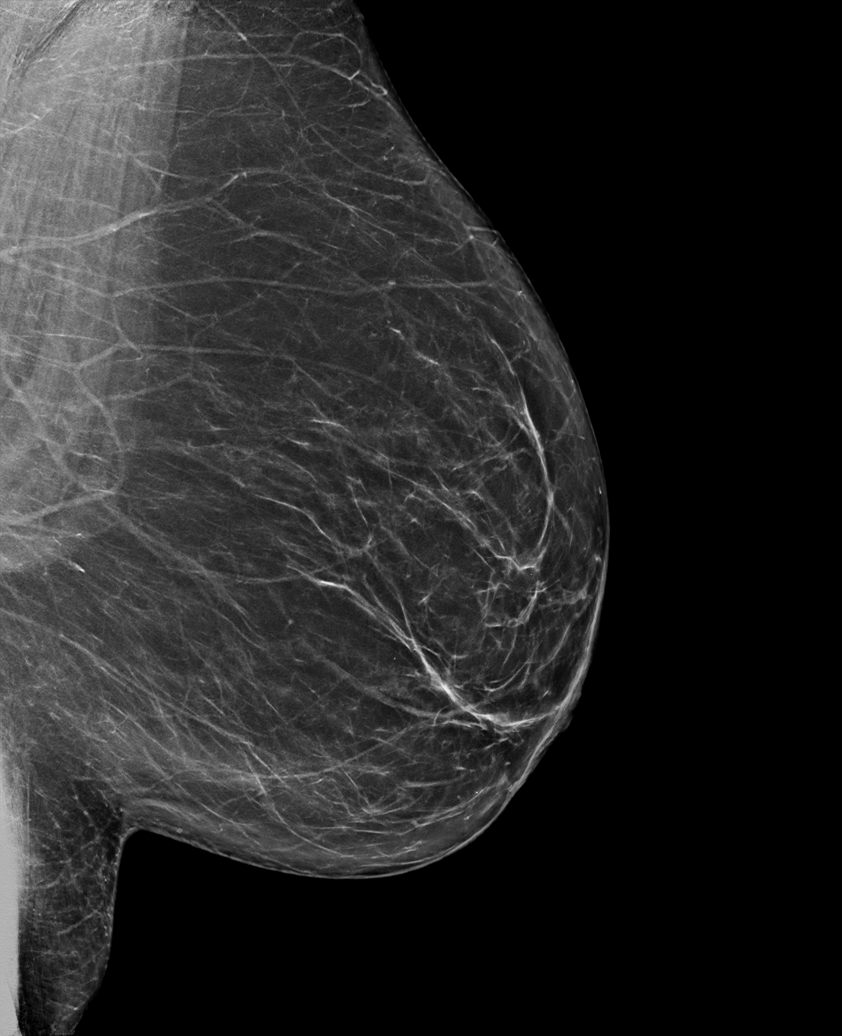

[6 of 36 positions shown; findings below may reference images not displayed]

ACR Breast Density Category b: There are scattered areas of
fibroglandular density.
FINDINGS: There are no findings suspicious for malignancy.
IMPRESSION: No mammographic evidence of malignancy. A result letter of this
screening mammogram will be mailed directly to the patient.

RECOMMENDATION:
Screening mammogram in one year. (Code:51-O-LD2)

BI-RADS CATEGORY  1: Negative.

## 2022-09-29 ENCOUNTER — Other Ambulatory Visit: Payer: Self-pay | Admitting: Family Medicine

## 2022-09-29 DIAGNOSIS — Z1231 Encounter for screening mammogram for malignant neoplasm of breast: Secondary | ICD-10-CM

## 2022-10-13 ENCOUNTER — Ambulatory Visit
Admission: RE | Admit: 2022-10-13 | Discharge: 2022-10-13 | Disposition: A | Discharge status: Home / Self Care | Payer: Medicaid Other | Source: Ambulatory Visit | Attending: Family Medicine | Admitting: Family Medicine

## 2022-10-13 DIAGNOSIS — Z1231 Encounter for screening mammogram for malignant neoplasm of breast: Secondary | ICD-10-CM | POA: Insufficient documentation

## 2023-01-06 ENCOUNTER — Other Ambulatory Visit (INDEPENDENT_AMBULATORY_CARE_PROVIDER_SITE_OTHER): Payer: Self-pay | Admitting: Nurse Practitioner

## 2023-01-06 DIAGNOSIS — I83813 Varicose veins of bilateral lower extremities with pain: Secondary | ICD-10-CM

## 2023-01-07 ENCOUNTER — Ambulatory Visit (INDEPENDENT_AMBULATORY_CARE_PROVIDER_SITE_OTHER): Payer: Medicaid Other

## 2023-01-07 ENCOUNTER — Ambulatory Visit (INDEPENDENT_AMBULATORY_CARE_PROVIDER_SITE_OTHER): Payer: Medicaid Other | Admitting: Nurse Practitioner

## 2023-01-07 ENCOUNTER — Encounter (INDEPENDENT_AMBULATORY_CARE_PROVIDER_SITE_OTHER): Payer: Self-pay | Admitting: Nurse Practitioner

## 2023-01-07 VITALS — BP 123/84 | HR 66 | Resp 16 | Ht 64.0 in | Wt 153.8 lb

## 2023-01-07 DIAGNOSIS — I83813 Varicose veins of bilateral lower extremities with pain: Secondary | ICD-10-CM

## 2023-01-07 NOTE — Progress Notes (Signed)
Subjective:    Patient ID: Kayla Ball, female    DOB: May 13, 1966, 57 y.o.   MRN: [id number] Chief Complaint  Patient presents with   New Patient (Initial Visit)    Ref Olmedo consult bilateral varicose veins with pain    The patient is seen for evaluation of symptomatic varicose veins.  She has notable varicosities in her bilateral thighs.  She also notes that she has some varicosities in her calf area that have throbbing sensations and are painful for her.. The patient also notes an aching and throbbing pain over the varicosities, particularly with prolonged dependent positions. The symptoms are significantly improved with elevation.  The patient also notes that during hot weather the symptoms are greatly intensified.  At this point, the symptoms are persistent and severe enough that they're having a negative impact on lifestyle and are interfering with daily activities.  There is no history of DVT, PE or superficial thrombophlebitis. There is no history of ulceration or hemorrhage. The patient denies a significant family history of varicose veins.  The patient has worn graduated compression in the past. At the present time the patient has  been using over-the-counter analgesics. There is no history of prior surgical intervention or sclerotherapy.  Today noninvasive study showed no evidence of DVT or superficial thrombophlebitis bilaterally.  No evidence of deep venous insufficiency bilaterally.  No evidence of superficial venous reflux in the great or small saphenous veins bilaterally.      Review of Systems  Cardiovascular:  Negative for leg swelling.  All other systems reviewed and are negative.      Objective:   Physical Exam Vitals reviewed.  HENT:     Head: Normocephalic.  Cardiovascular:     Rate and Rhythm: Normal rate.     Pulses: Normal pulses.  Pulmonary:     Effort: Pulmonary effort is normal.  Musculoskeletal:        General: Tenderness present.   Skin:    General: Skin is warm and dry.     Comments: Notable varicosities in the bilateral lower extremities   Neurological:     Mental Status: She is alert and oriented to person, place, and time.  Psychiatric:        Mood and Affect: Mood normal.        Behavior: Behavior normal.        Thought Content: Thought content normal.        Judgment: Judgment normal.     BP 123/84 (BP Location: Left Arm)   Pulse 66   Resp 16   Ht 5\' 4"  (1.626 m)   Wt 153 lb 12.8 oz (69.8 kg)   BMI 26.40 kg/m   Past Medical History:  Diagnosis Date   Anxiety    IDA (iron deficiency anemia)    Panic attacks     Social History   Socioeconomic History   Marital status: Divorced    Spouse name: Not on file   Number of children: Not on file   Years of education: Not on file   Highest education level: Not on file  Occupational History   Not on file  Tobacco Use   Smoking status: Never   Smokeless tobacco: Never  Vaping Use   Vaping Use: Never used  Substance and Sexual Activity   Alcohol use: Yes    Comment: occasional, social   Drug use: No   Sexual activity: Not on file  Other Topics Concern   Not on file  Social History Narrative   ** Merged History Encounter **       Social Determinants of Health   Financial Resource Strain: Not on file  Food Insecurity: Not on file  Transportation Needs: Not on file  Physical Activity: Not on file  Stress: Not on file  Social Connections: Not on file  Intimate Partner Violence: Not on file    Past Surgical History:  Procedure Laterality Date   COLONOSCOPY WITH PROPOFOL N/A 10/07/2021   Procedure: COLONOSCOPY WITH PROPOFOL;  Surgeon: Midge Minium, MD;  Location: [facility];  Service: Endoscopy;  Laterality: N/A;    Family History  Family history unknown: Yes    No Known Allergies     Latest Ref Rng & Units 01/13/2019    1:22 PM 04/19/2017   10:26 AM 08/16/2013    1:21 PM  CBC  WBC 4.0 - 10.5 K/uL 7.2  5.5  7.7    Hemoglobin 12.0 - 15.0 g/dL 16.1  09.6  04.5   Hematocrit 36.0 - 46.0 % 39.8  36.7  36.7   Platelets 150 - 400 K/uL 365  332  346       CMP     Component Value Date/Time   NA 138 01/13/2019 1322   NA 137 08/16/2013 1321   K 3.5 01/13/2019 1322   K 3.9 08/16/2013 1321   CL 103 01/13/2019 1322   CL 106 08/16/2013 1321   CO2 24 01/13/2019 1322   CO2 26 08/16/2013 1321   GLUCOSE 96 01/13/2019 1322   GLUCOSE 84 08/16/2013 1321   BUN 11 01/13/2019 1322   BUN 10 08/16/2013 1321   CREATININE 0.54 01/13/2019 1322   CREATININE 0.53 (L) 08/16/2013 1321   CALCIUM 9.3 01/13/2019 1322   CALCIUM 8.1 (L) 08/16/2013 1321   PROT 8.0 01/13/2019 1322   PROT 6.8 08/16/2013 1321   ALBUMIN 4.3 01/13/2019 1322   ALBUMIN 3.2 (L) 08/16/2013 1321   AST 23 01/13/2019 1322   AST 19 08/16/2013 1321   ALT 31 01/13/2019 1322   ALT 20 08/16/2013 1321   ALKPHOS 123 01/13/2019 1322   ALKPHOS 73 08/16/2013 1321   BILITOT 0.7 01/13/2019 1322   BILITOT 0.2 08/16/2013 1321   GFRNONAA >60 01/13/2019 1322   GFRNONAA >60 08/16/2013 1321     No results found.     Assessment & Plan:   1. Varicose veins of bilateral lower extremities with pain Recommend:  The patient has persistent symptoms of pain that are having a negative impact on daily life and daily activities.  Patient should undergo injection sclerotherapy to treat the residual varicosities.  The risks, benefits and alternative therapies were reviewed in detail with the patient.  All questions were answered.  The patient agrees to proceed with sclerotherapy at their convenience.  The patient will continue wearing the graduated compression stockings and using the over-the-counter pain medications to treat her symptoms.       Current Outpatient Medications on File Prior to Visit  Medication Sig Dispense Refill   citalopram (CELEXA) 40 MG tablet Take by mouth.     pantoprazole (PROTONIX) 40 MG tablet Take by mouth.     Spacer/Aero-Holding  Chambers (AEROCHAMBER PLUS) inhaler Use as instructed (Patient not taking: Reported on 01/07/2023) 1 each 2   No current facility-administered medications on file prior to visit.    There are no Patient Instructions on file for this visit. No follow-ups on file.   Georgiana Spinner, NP

## 2023-02-20 ENCOUNTER — Ambulatory Visit (INDEPENDENT_AMBULATORY_CARE_PROVIDER_SITE_OTHER): Payer: Medicaid Other | Admitting: Nurse Practitioner

## 2023-03-20 ENCOUNTER — Ambulatory Visit (INDEPENDENT_AMBULATORY_CARE_PROVIDER_SITE_OTHER): Payer: Medicaid Other | Admitting: Nurse Practitioner

## 2023-12-08 ENCOUNTER — Encounter (INDEPENDENT_AMBULATORY_CARE_PROVIDER_SITE_OTHER): Payer: Self-pay

## 2023-12-10 ENCOUNTER — Other Ambulatory Visit: Payer: Self-pay | Admitting: Obstetrics and Gynecology

## 2023-12-10 DIAGNOSIS — Z1231 Encounter for screening mammogram for malignant neoplasm of breast: Secondary | ICD-10-CM

## 2024-07-22 ENCOUNTER — Encounter
